# Patient Record
Sex: Male | Born: 1975 | Race: White | Hispanic: No | Marital: Married | Smoking: Never smoker
Health system: Southern US, Community
[De-identification: ages and names within clinical notes are randomized; demographics above are authoritative.]

## PROBLEM LIST (undated history)

## (undated) DIAGNOSIS — E0590 Thyrotoxicosis, unspecified without thyrotoxic crisis or storm: Secondary | ICD-10-CM

## (undated) DIAGNOSIS — G5600 Carpal tunnel syndrome, unspecified upper limb: Secondary | ICD-10-CM

## (undated) DIAGNOSIS — K602 Anal fissure, unspecified: Secondary | ICD-10-CM

## (undated) DIAGNOSIS — R519 Headache, unspecified: Secondary | ICD-10-CM

## (undated) DIAGNOSIS — T7840XA Allergy, unspecified, initial encounter: Secondary | ICD-10-CM

## (undated) DIAGNOSIS — R51 Headache: Secondary | ICD-10-CM

## (undated) DIAGNOSIS — R5383 Other fatigue: Secondary | ICD-10-CM

## (undated) DIAGNOSIS — K219 Gastro-esophageal reflux disease without esophagitis: Secondary | ICD-10-CM

## (undated) DIAGNOSIS — F419 Anxiety disorder, unspecified: Secondary | ICD-10-CM

## (undated) DIAGNOSIS — G8929 Other chronic pain: Secondary | ICD-10-CM

## (undated) HISTORY — DX: Allergy, unspecified, initial encounter: T78.40XA

## (undated) HISTORY — PX: TONSILLECTOMY: SUR1361

## (undated) HISTORY — DX: Headache: R51

## (undated) HISTORY — DX: Other fatigue: R53.83

## (undated) HISTORY — DX: Gastro-esophageal reflux disease without esophagitis: K21.9

## (undated) HISTORY — DX: Anal fissure, unspecified: K60.2

## (undated) HISTORY — PX: LASIK: SHX215

## (undated) HISTORY — DX: Thyrotoxicosis, unspecified without thyrotoxic crisis or storm: E05.90

## (undated) HISTORY — DX: Carpal tunnel syndrome, unspecified upper limb: G56.00

## (undated) HISTORY — DX: Headache, unspecified: R51.9

## (undated) HISTORY — DX: Anxiety disorder, unspecified: F41.9

## (undated) HISTORY — DX: Headache, unspecified: G89.29

---

## 2004-07-13 ENCOUNTER — Ambulatory Visit: Payer: Self-pay | Admitting: Family Medicine

## 2004-10-27 ENCOUNTER — Ambulatory Visit: Payer: Self-pay | Admitting: Family Medicine

## 2004-10-31 ENCOUNTER — Ambulatory Visit: Payer: Self-pay | Admitting: Family Medicine

## 2004-11-08 ENCOUNTER — Ambulatory Visit: Payer: Self-pay | Admitting: Family Medicine

## 2004-11-08 ENCOUNTER — Encounter: Admission: RE | Admit: 2004-11-08 | Discharge: 2004-11-08 | Payer: Self-pay | Admitting: Family Medicine

## 2005-07-10 HISTORY — PX: APPENDECTOMY: SHX54

## 2005-09-15 ENCOUNTER — Ambulatory Visit: Payer: Self-pay | Admitting: Family Medicine

## 2005-09-18 ENCOUNTER — Ambulatory Visit: Payer: Self-pay | Admitting: Family Medicine

## 2005-12-25 ENCOUNTER — Ambulatory Visit: Payer: Self-pay | Admitting: Cardiology

## 2005-12-25 ENCOUNTER — Inpatient Hospital Stay (HOSPITAL_COMMUNITY): Admission: EM | Admit: 2005-12-25 | Discharge: 2005-12-26 | Payer: Self-pay | Admitting: Emergency Medicine

## 2005-12-25 ENCOUNTER — Encounter (INDEPENDENT_AMBULATORY_CARE_PROVIDER_SITE_OTHER): Payer: Self-pay | Admitting: Specialist

## 2005-12-25 ENCOUNTER — Ambulatory Visit: Payer: Self-pay | Admitting: Family Medicine

## 2006-01-07 ENCOUNTER — Emergency Department (HOSPITAL_COMMUNITY): Admission: EM | Admit: 2006-01-07 | Discharge: 2006-01-08 | Payer: Self-pay | Admitting: Emergency Medicine

## 2006-03-19 ENCOUNTER — Ambulatory Visit: Payer: Self-pay | Admitting: Family Medicine

## 2006-08-30 ENCOUNTER — Ambulatory Visit: Payer: Self-pay | Admitting: Family Medicine

## 2006-10-10 ENCOUNTER — Ambulatory Visit: Payer: Self-pay | Admitting: Family Medicine

## 2006-10-26 ENCOUNTER — Ambulatory Visit: Payer: Self-pay | Admitting: Family Medicine

## 2006-10-29 ENCOUNTER — Ambulatory Visit: Payer: Self-pay | Admitting: Family Medicine

## 2007-02-28 ENCOUNTER — Telehealth (INDEPENDENT_AMBULATORY_CARE_PROVIDER_SITE_OTHER): Payer: Self-pay | Admitting: *Deleted

## 2007-07-13 IMAGING — CT CT ABDOMEN W/ CM
2 of 5 series · 17 of 46 positions shown, 19 images · IV contrast (APPLIED)
Comparison: none

CLINICAL DATA: Abdominal pain.  Diarrhea, nausea, question appendicitis. 
 ABDOMEN CT WITH CONTRAST:
TECHNIQUE: Multidetector CT imaging of the abdomen was performed following the standard protocol during bolus administration of intravenous contrast.
 Contrast:  125 cc Omnipaque 300
TECHNIQUE: Multidetector CT imaging of the pelvis was performed following the standard protocol during bolus administration of intravenous contrast.

[Series 2: abd_pel 5.0 b30f st · axial · 0.68mm/px · z∈[-432,-72]mm · 14 of 82 slices shown, 16 images]
[im 5/82  soft-tissue]
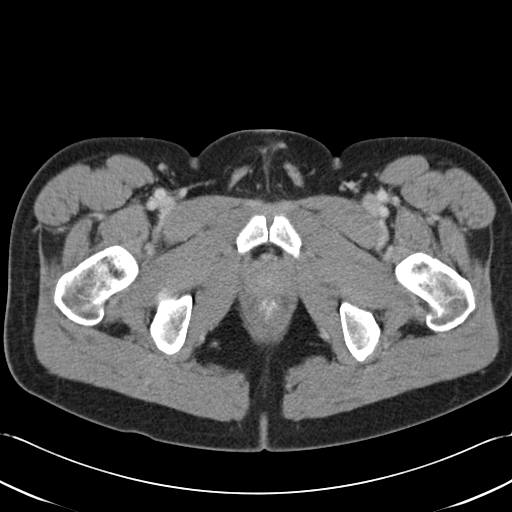
[im 5/82  bone]
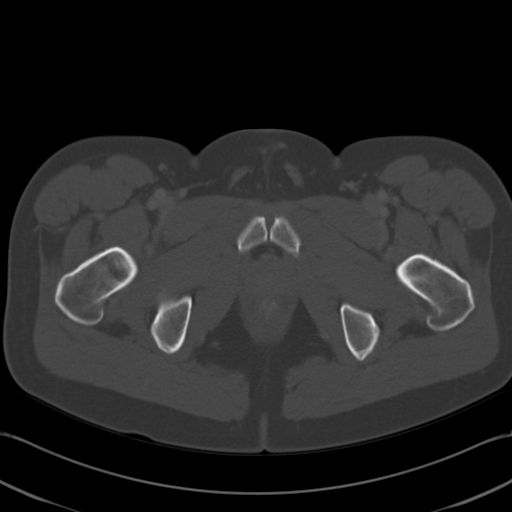
[im 10/82  soft-tissue]
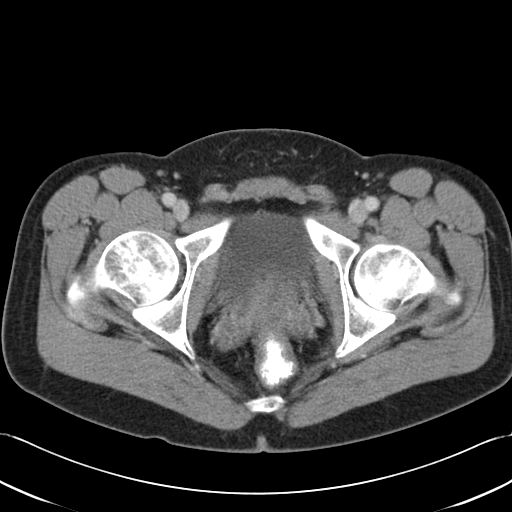
[im 15/82  soft-tissue]
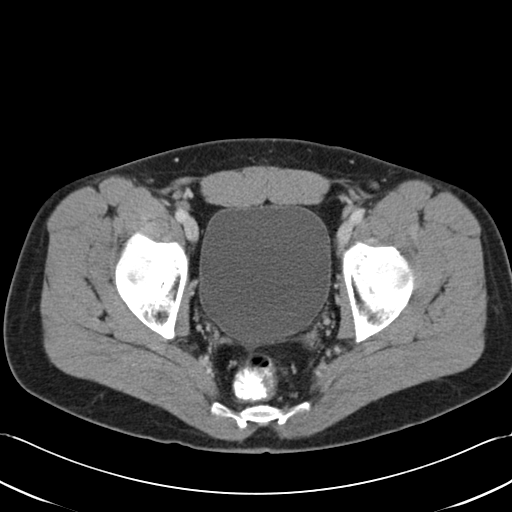
[im 24/82  soft-tissue]
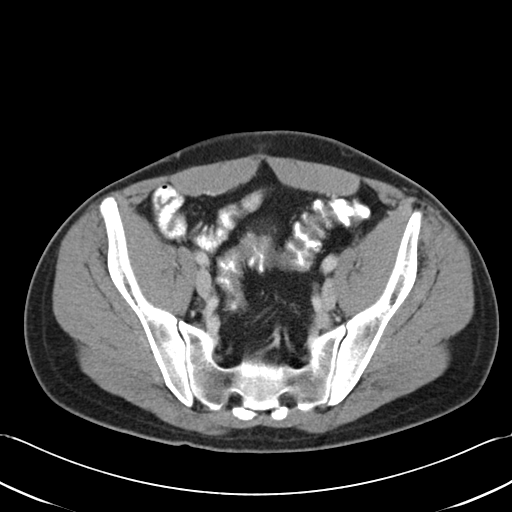
[im 29/82  soft-tissue]
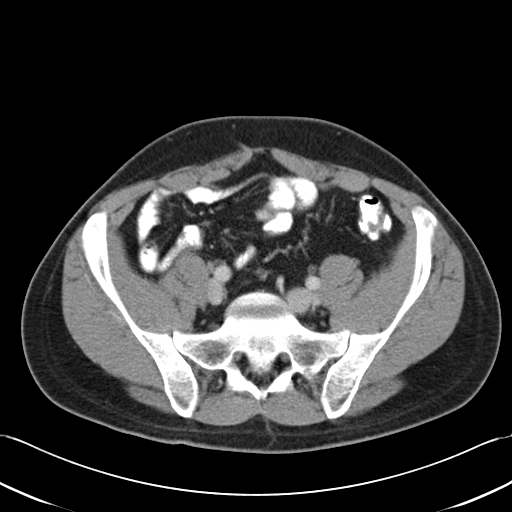
[im 34/82  soft-tissue]
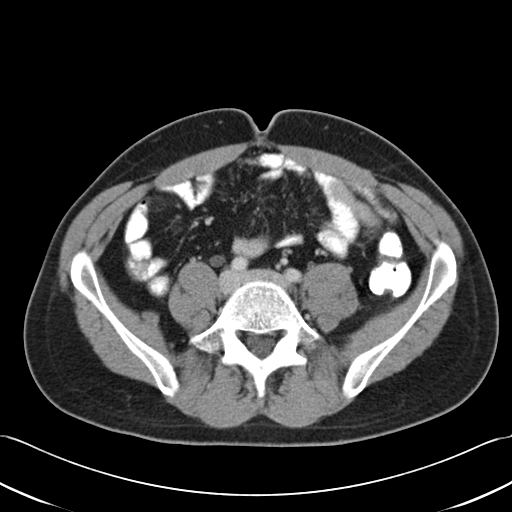
[im 39/82  soft-tissue]
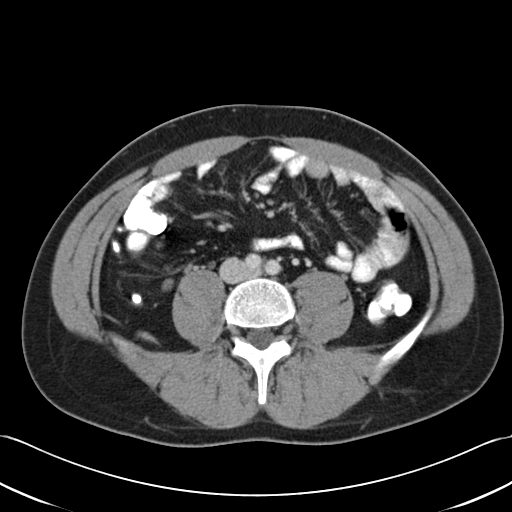
[im 43/82  soft-tissue]
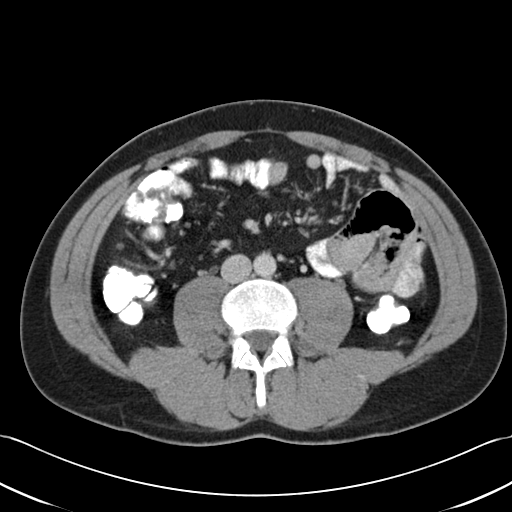
[im 48/82  soft-tissue]
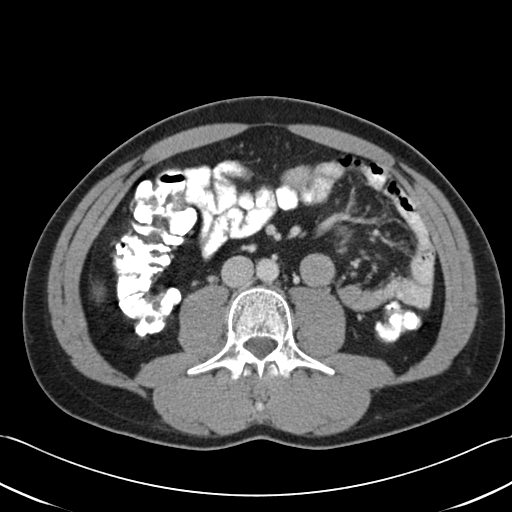
[im 48/82  bone]
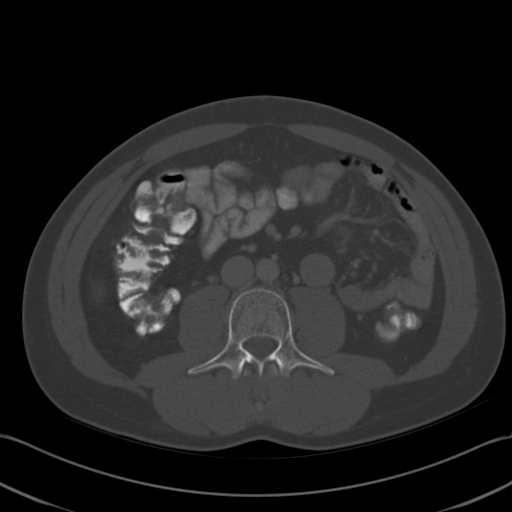
[im 53/82  soft-tissue]
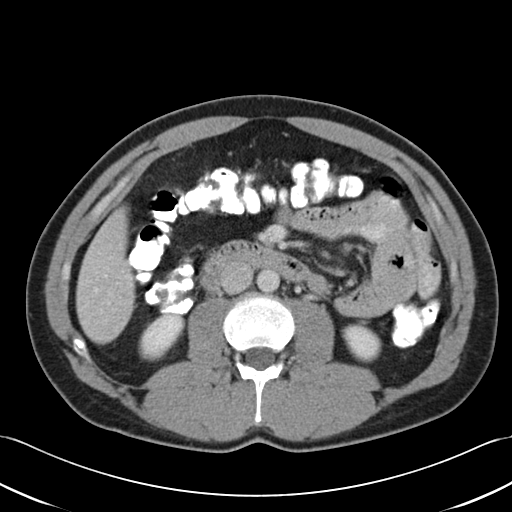
[im 62/82  soft-tissue]
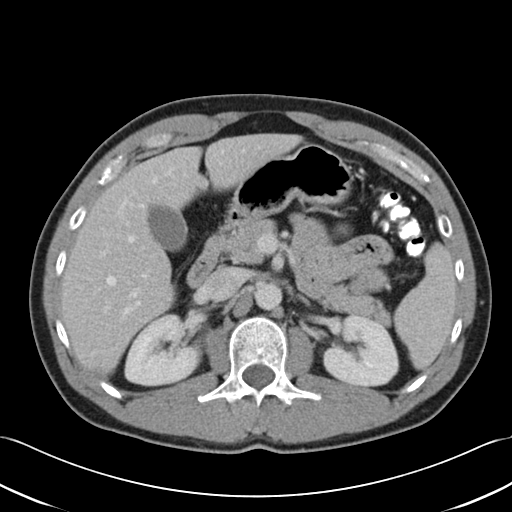
[im 67/82  soft-tissue]
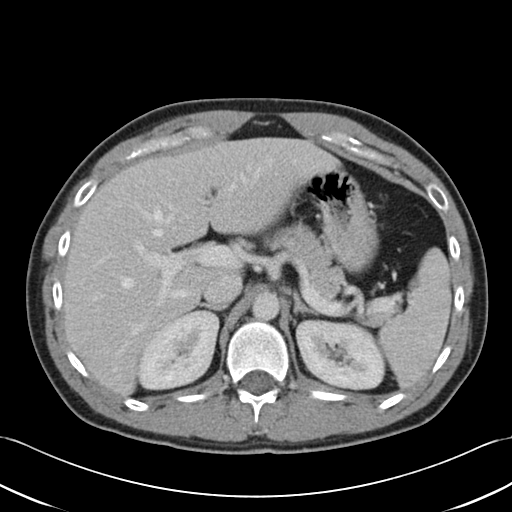
[im 72/82  soft-tissue]
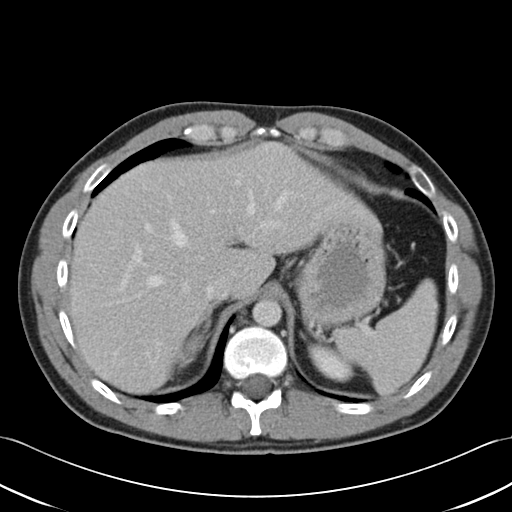
[im 77/82  soft-tissue]
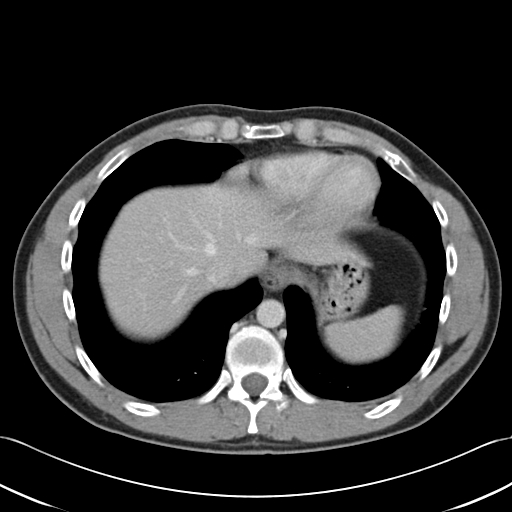

[Series 602: <mpr range> · coronal · 0.83mm/px · 3 of 39 slices shown]
[im 13/39  soft-tissue]
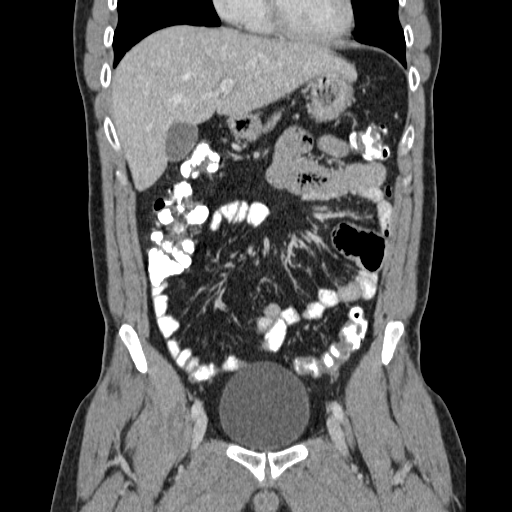
[im 17/39  soft-tissue]
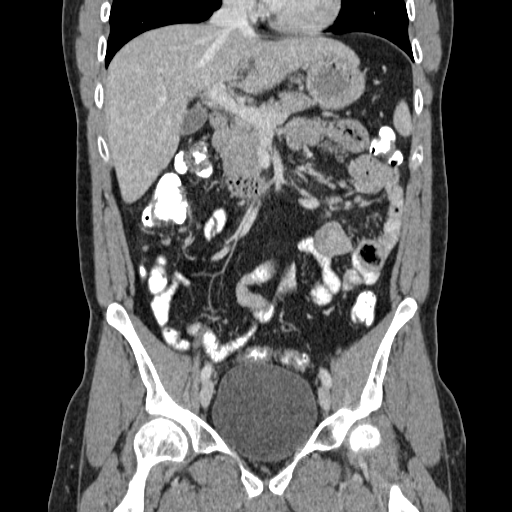
[im 22/39  soft-tissue]
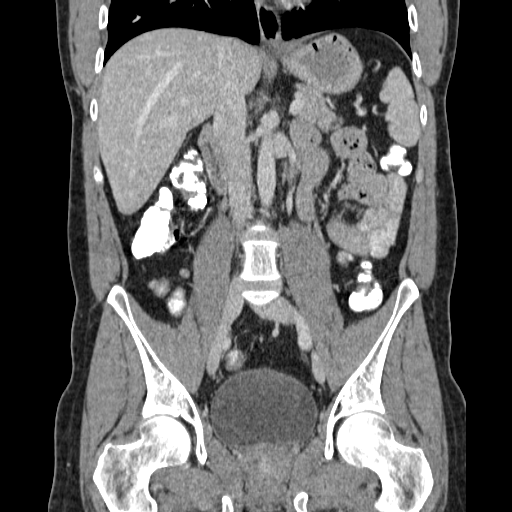

[17 of 46 positions shown; findings below may reference images not displayed]

FINDINGS: The appendix is distended measuring up to 10 mm in diameter with a thickened wall, particularly within its proximal portion.  There are several calcified appendicoliths present.  There is mild ill definition of the periappendiceal fat noted.  The liver, spleen, pancreas, kidneys, and adrenal glands have a normal appearance.  There is a small hiatal hernia.  There is no adenopathy and there is no free peritoneal fluid or air.  There are several mildly enlarged mesenteric lymph nodes seen within the right lower quadrant.
IMPRESSION: Findings are consistent with acute appendicitis with appendicolith as discussed above.  No evidence for an abscess.  Mildly enlarged, likely reactive lymph nodes within the mesentery right lower quadrant.  
 PELVIS CT WITH CONTRAST:
FINDINGS: There is no pelvic mass or adenopathy and there is no free pelvic fluid.  Mild sigmoid colonic diverticulosis is noted.  The urinary bladder is distended but has a normal appearance.
IMPRESSION: Negative CT of the pelvis.  Please see CT abdomen report for findings with regards to the appendix.

## 2007-10-09 ENCOUNTER — Ambulatory Visit: Payer: Self-pay | Admitting: Family Medicine

## 2007-10-09 DIAGNOSIS — R519 Headache, unspecified: Secondary | ICD-10-CM | POA: Insufficient documentation

## 2007-10-09 DIAGNOSIS — J45909 Unspecified asthma, uncomplicated: Secondary | ICD-10-CM | POA: Insufficient documentation

## 2007-10-09 DIAGNOSIS — J309 Allergic rhinitis, unspecified: Secondary | ICD-10-CM | POA: Insufficient documentation

## 2007-10-09 DIAGNOSIS — R51 Headache: Secondary | ICD-10-CM | POA: Insufficient documentation

## 2007-12-09 ENCOUNTER — Telehealth: Payer: Self-pay | Admitting: Family Medicine

## 2007-12-13 ENCOUNTER — Ambulatory Visit: Payer: Self-pay | Admitting: Family Medicine

## 2007-12-31 ENCOUNTER — Ambulatory Visit: Payer: Self-pay | Admitting: Family Medicine

## 2008-01-02 ENCOUNTER — Telehealth: Payer: Self-pay | Admitting: Gastroenterology

## 2008-01-07 ENCOUNTER — Ambulatory Visit: Payer: Self-pay | Admitting: Gastroenterology

## 2008-01-07 LAB — CONVERTED CEMR LAB
ALT: 35 units/L (ref 0–53)
AST: 26 units/L (ref 0–37)
Albumin: 4.4 g/dL (ref 3.5–5.2)
Alkaline Phosphatase: 56 units/L (ref 39–117)
Basophils Absolute: 0 10*3/uL (ref 0.0–0.1)
Basophils Relative: 0.3 % (ref 0.0–1.0)
Bilirubin, Direct: 0.1 mg/dL (ref 0.0–0.3)
Creatinine, Ser: 0.9 mg/dL (ref 0.4–1.5)
Folate: 12.9 ng/mL
Iron: 61 ug/dL (ref 42–165)
Lymphocytes Relative: 37 % (ref 12.0–46.0)
MCV: 88.8 fL (ref 78.0–100.0)
Monocytes Absolute: 0.4 10*3/uL (ref 0.1–1.0)
Neutrophils Relative %: 49.4 % (ref 43.0–77.0)
Potassium: 4.2 meq/L (ref 3.5–5.1)
RBC: 5.03 M/uL (ref 4.22–5.81)
Sed Rate: 4 mm/hr (ref 0–16)
TSH: 1.05 microintl units/mL (ref 0.35–5.50)
Total Protein: 7 g/dL (ref 6.0–8.3)
Transferrin: 229.3 mg/dL (ref >=212.0)
Vitamin B-12: 797 pg/mL (ref 211–911)

## 2008-01-08 ENCOUNTER — Ambulatory Visit (HOSPITAL_COMMUNITY): Admission: RE | Admit: 2008-01-08 | Discharge: 2008-01-08 | Payer: Self-pay | Admitting: Gastroenterology

## 2008-01-15 ENCOUNTER — Ambulatory Visit: Payer: Self-pay | Admitting: Gastroenterology

## 2008-01-15 ENCOUNTER — Encounter: Payer: Self-pay | Admitting: Gastroenterology

## 2008-01-15 HISTORY — PX: ESOPHAGOGASTRODUODENOSCOPY: SHX1529

## 2008-01-17 ENCOUNTER — Encounter: Payer: Self-pay | Admitting: Gastroenterology

## 2008-02-20 ENCOUNTER — Ambulatory Visit: Payer: Self-pay | Admitting: Gastroenterology

## 2008-04-14 ENCOUNTER — Ambulatory Visit: Payer: Self-pay | Admitting: Family Medicine

## 2008-07-08 ENCOUNTER — Ambulatory Visit: Payer: Self-pay | Admitting: Family Medicine

## 2008-08-28 ENCOUNTER — Ambulatory Visit: Payer: Self-pay | Admitting: Family Medicine

## 2008-11-11 ENCOUNTER — Ambulatory Visit: Payer: Self-pay | Admitting: Family Medicine

## 2009-05-17 ENCOUNTER — Ambulatory Visit: Payer: Self-pay | Admitting: Family Medicine

## 2009-07-26 IMAGING — US US ABDOMEN COMPLETE
1 series · 14 of 25 positions shown · non-contrast
Comparison: CT 01/07/2006

CLINICAL DATA: Abdominal pain

ABDOMEN ULTRASOUND
TECHNIQUE: Complete abdominal ultrasound examination was performed
including evaluation of the liver, gallbladder, bile ducts,
pancreas, kidneys, spleen, IVC, and abdominal aorta.

[Series 1: unknown · 0.33mm/px · 14 of 87 slices shown]
[im 1/87]
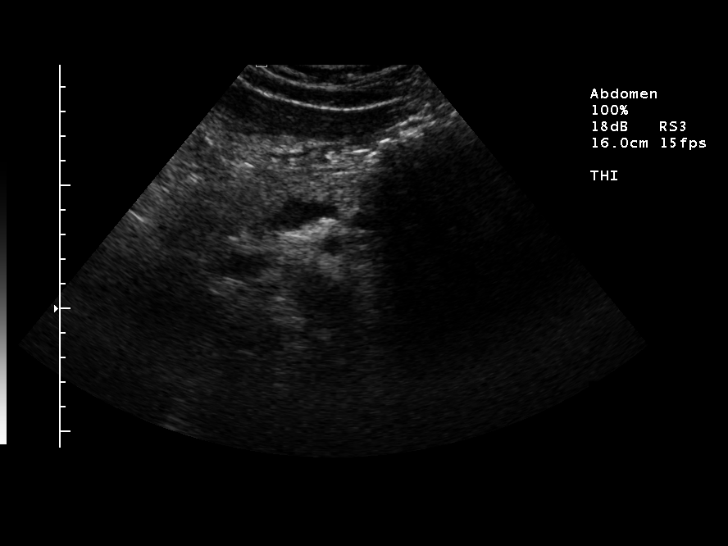
[im 8/87]
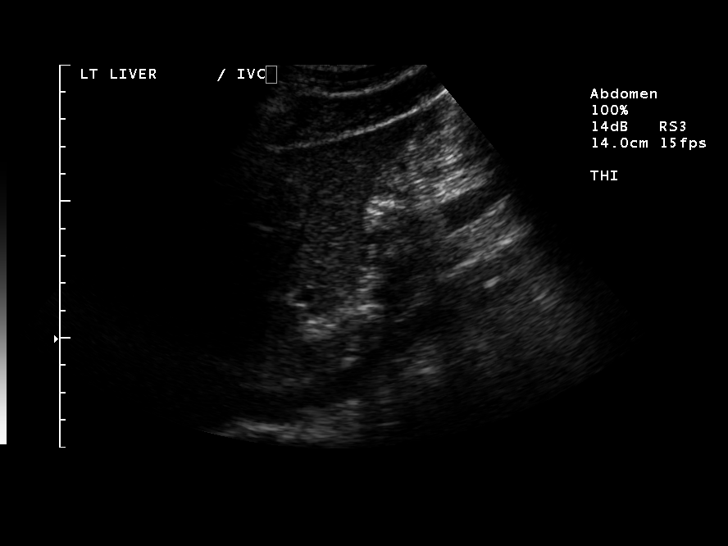
[im 15/87]
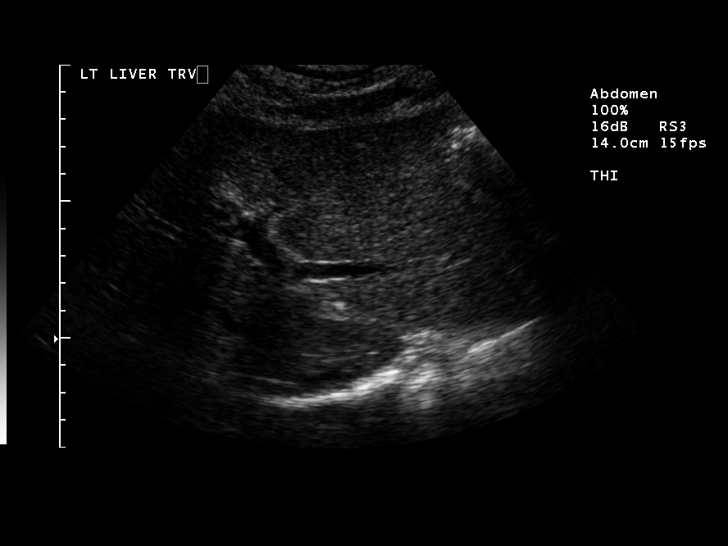
[im 22/87]
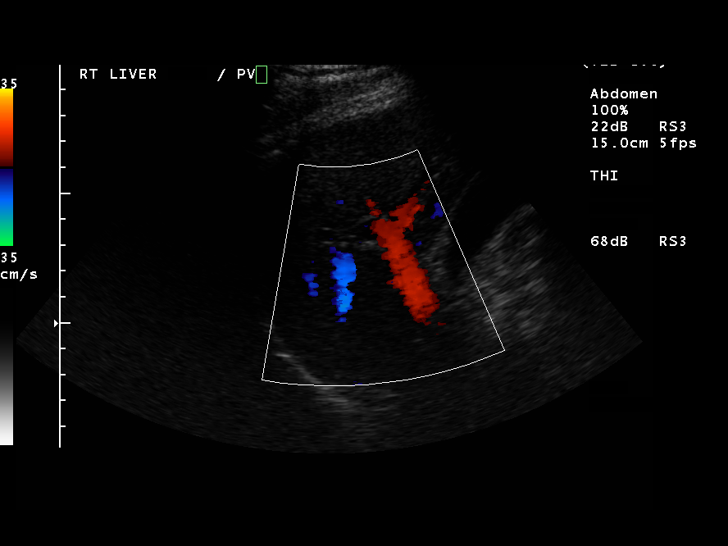
[im 29/87]
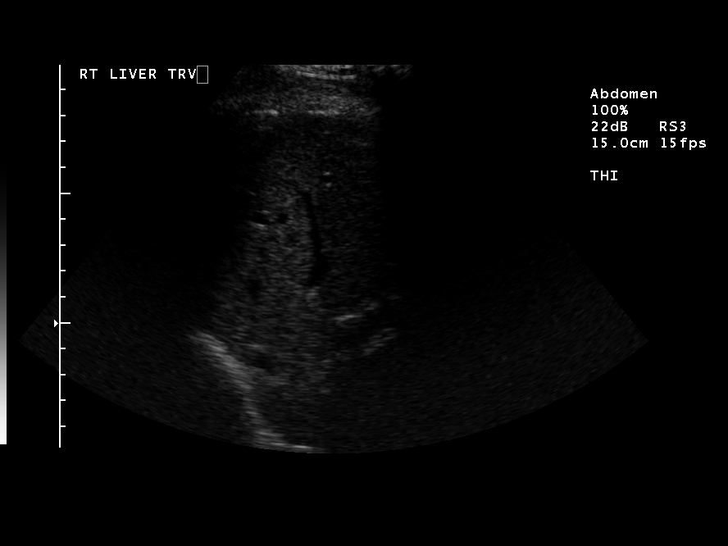
[im 33/87]
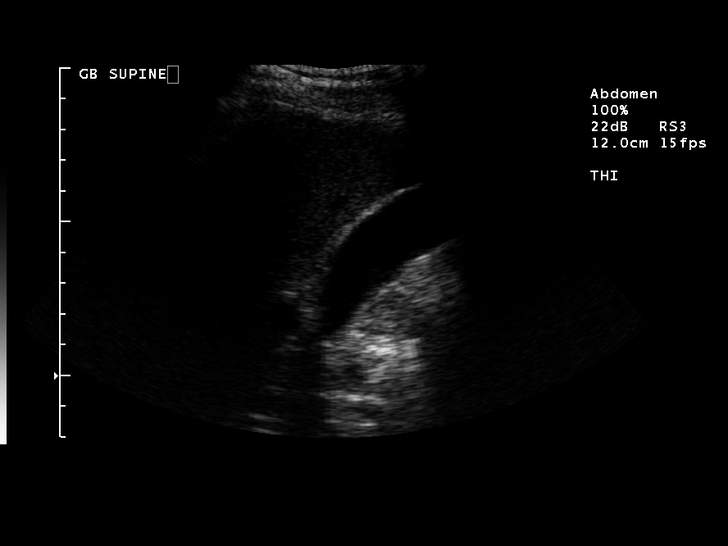
[im 40/87]
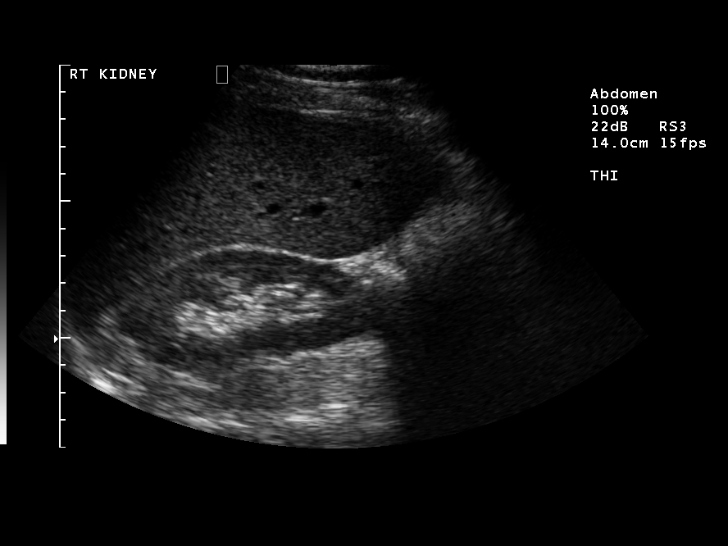
[im 47/87]
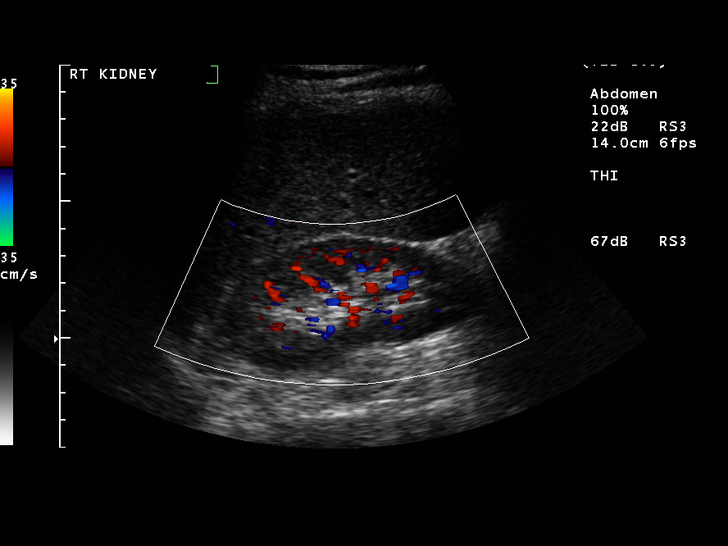
[im 54/87]
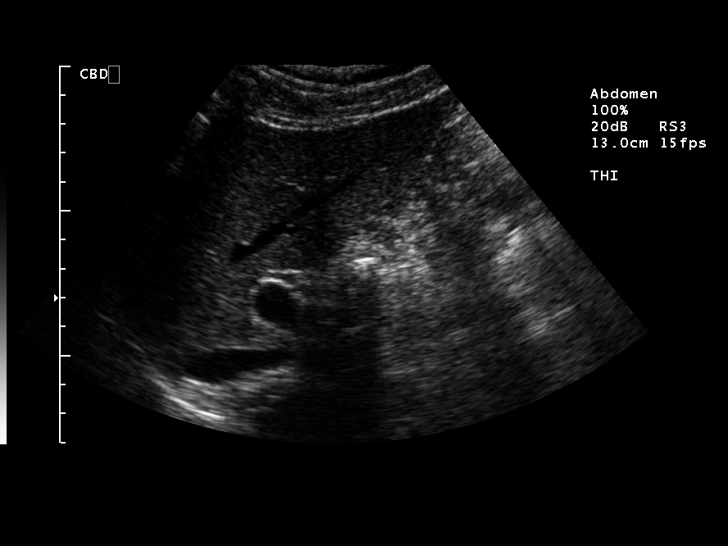
[im 58/87]
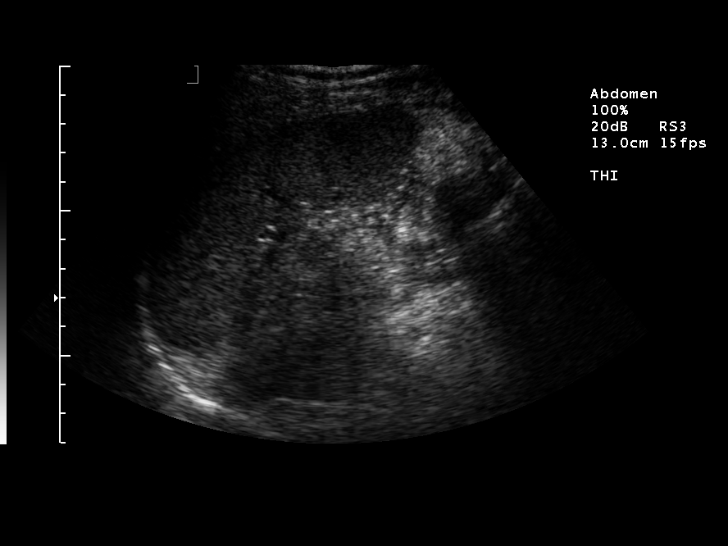
[im 65/87]
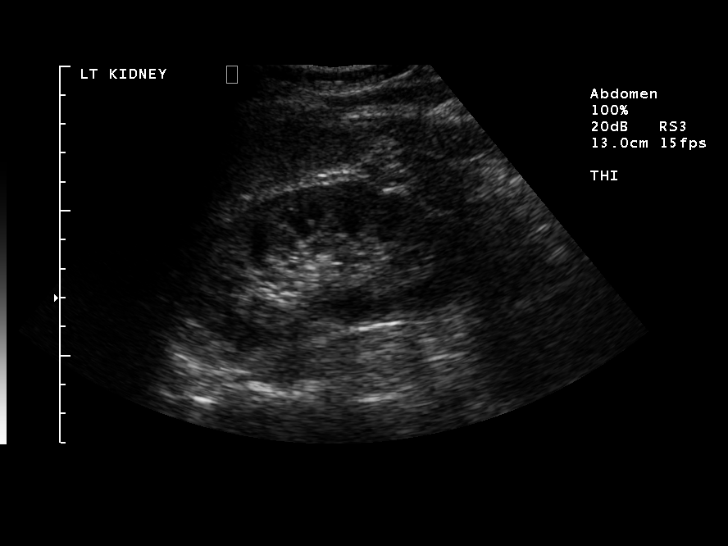
[im 72/87]
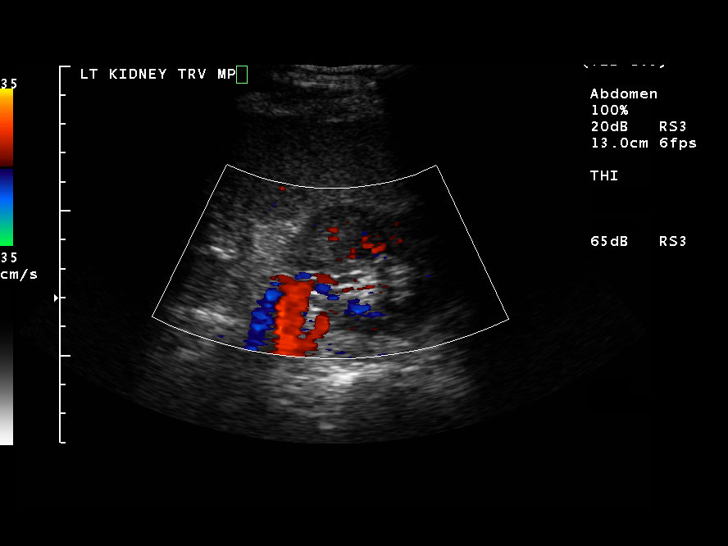
[im 79/87]
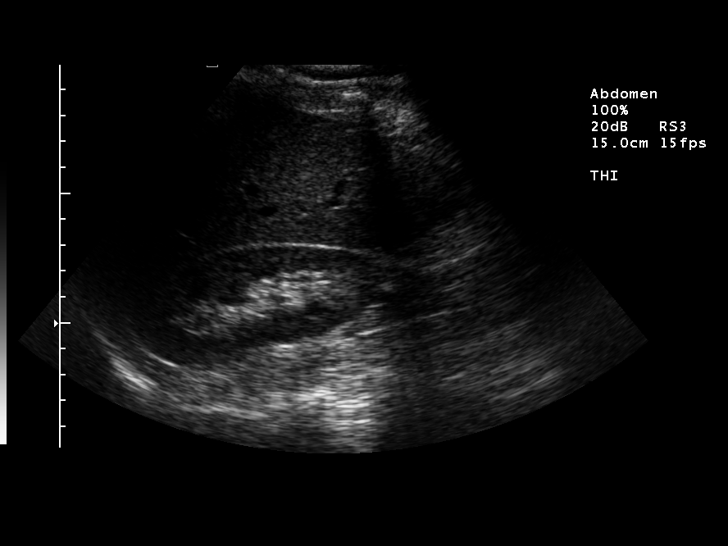
[im 87/87]
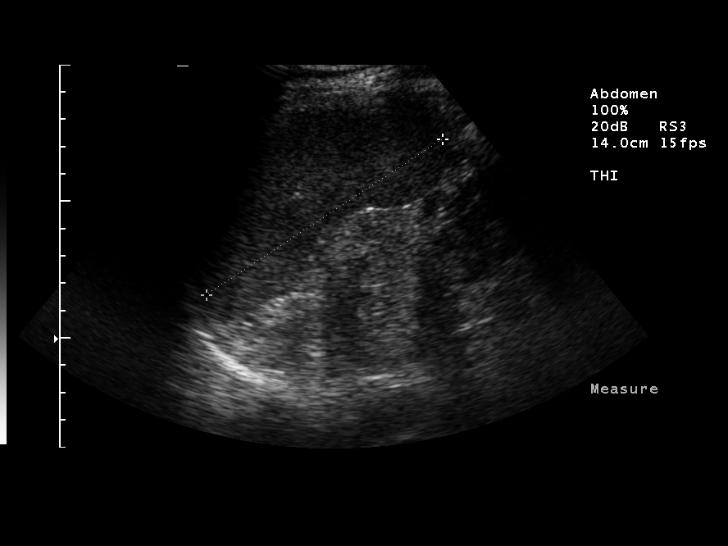

[14 of 25 positions shown; findings below may reference images not displayed]

FINDINGS: The gallbladder is well distended and there are no
gallstones.  The common bile duct is 2.9 mm.  The liver and IVC are
normal.  The pancreatic head and body are normal and the tail was
obscured by bowel gas.  The spleen, kidneys, and aorta are normal.
IMPRESSION: Negative

## 2009-08-16 ENCOUNTER — Encounter: Payer: Self-pay | Admitting: Family Medicine

## 2009-09-15 ENCOUNTER — Ambulatory Visit: Payer: Self-pay | Admitting: Family Medicine

## 2009-09-15 LAB — CONVERTED CEMR LAB
Nitrite: NEGATIVE
Protein, U semiquant: NEGATIVE

## 2009-09-16 LAB — CONVERTED CEMR LAB
ALT: 48 units/L (ref 0–53)
Albumin: 4.2 g/dL (ref 3.5–5.2)
Alkaline Phosphatase: 56 units/L (ref 39–117)
Basophils Relative: 0.3 % (ref 0.0–3.0)
Chloride: 107 meq/L (ref 96–112)
Cholesterol: 183 mg/dL (ref 0–200)
Eosinophils Relative: 2.4 % (ref 0.0–5.0)
HCT: 44.1 % (ref 39.0–52.0)
HDL: 43.4 mg/dL (ref >39.00)
Lymphocytes Relative: 34.1 % (ref 12.0–46.0)
Lymphs Abs: 1.5 10*3/uL (ref 0.7–4.0)
MCHC: 32.8 g/dL (ref 30.0–36.0)
Monocytes Relative: 10.7 % (ref 3.0–12.0)
Platelets: 195 10*3/uL (ref 150.0–400.0)
Potassium: 4 meq/L (ref 3.5–5.1)
RDW: 12.5 % (ref 11.5–14.6)
Sodium: 143 meq/L (ref 135–145)
TSH: 0.16 microintl units/mL — ABNORMAL LOW (ref 0.35–5.50)
Total CHOL/HDL Ratio: 4
WBC: 4.5 10*3/uL (ref 4.5–10.5)

## 2009-09-21 ENCOUNTER — Ambulatory Visit: Payer: Self-pay | Admitting: Family Medicine

## 2009-09-22 LAB — CONVERTED CEMR LAB
Free T4: 1.6 ng/dL (ref 0.6–1.6)
TSH: 0.06 microintl units/mL — ABNORMAL LOW (ref 0.35–5.50)

## 2009-10-01 ENCOUNTER — Ambulatory Visit: Payer: Self-pay | Admitting: Endocrinology

## 2009-10-08 ENCOUNTER — Encounter: Admission: RE | Admit: 2009-10-08 | Discharge: 2009-10-08 | Payer: Self-pay | Admitting: Endocrinology

## 2009-10-19 ENCOUNTER — Encounter (HOSPITAL_COMMUNITY): Admission: RE | Admit: 2009-10-19 | Discharge: 2010-01-17 | Payer: Self-pay | Admitting: Endocrinology

## 2010-07-06 ENCOUNTER — Ambulatory Visit
Admission: RE | Admit: 2010-07-06 | Discharge: 2010-07-06 | Payer: Self-pay | Source: Home / Self Care | Attending: Family Medicine | Admitting: Family Medicine

## 2010-07-06 DIAGNOSIS — G5600 Carpal tunnel syndrome, unspecified upper limb: Secondary | ICD-10-CM | POA: Insufficient documentation

## 2010-08-03 ENCOUNTER — Ambulatory Visit
Admission: RE | Admit: 2010-08-03 | Discharge: 2010-08-03 | Payer: Self-pay | Source: Home / Self Care | Attending: Family Medicine | Admitting: Family Medicine

## 2010-08-09 NOTE — Assessment & Plan Note (Signed)
Summary: NEW / HYPERTHYROIDISM / # / [employer] / FRY / CD   Vital Signs:  Patient profile:   35 year old male Height:      69 inches (175.26 cm) Weight:      164.75 pounds (74.89 kg) O2 Sat:      97 % on Room air Temp:     98.2 degrees F (36.78 degrees C) oral Pulse rate:   78 / minute BP sitting:   114 / 80  (left arm) Cuff size:   regular  Vitals Entered By: Josph Macho RMA (October 01, 2009 2:27 PM) Taken by Sydell Axon  O2 Flow:  Room air CC: New endo pt: hyperthyroidism //[place]   Primary Provider:  Gershon Crane MD  CC:  New endo pt: hyperthyroidism //[place].  History of Present Illness: pt was noted on recent routine labs to have abnormally suppressed tsh (was normal in 2009).  symptomatically, pt states few weeks of slight "fogginess" in the head, but no associated loc.  Current Medications (verified): 1)  Singulair 10 Mg Tabs (Montelukast Sodium) .Marland Kitchen.. 1 By Mouth Daily 2)  Allegra 180 Mg Tabs (Fexofenadine Hcl) .Marland Kitchen.. 1 By Mouth Once Daily 3)  Nexium 40 Mg  Cpdr (Esomeprazole Magnesium) .... Two Times A Day 4)  Epipen 2-Pak 0.3 Mg/0.20ml (1:1000)  Devi (Epinephrine Hcl (Anaphylaxis)) .... As Needed  Allergies (verified): No Known Drug Allergies  Past History:  Past Medical History: Last updated: 09/21/2009 Allergic rhinitis Asthma Headache GERD  Family History: Reviewed history from 02/20/2008 and no changes required. Family History High cholesterol Family History Hypertension Family History of Heart Disease: father, grandfather ? Family History of Liver Disease/Cirrhosis:father No FH of Colon Cancer no goiter or other thyroid dz.  Social History: Reviewed history from 01/07/2008 and no changes required. 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  Review of Systems       denies headache, hoarseness, double vision, palpitations, sob, diarrhea, polyuria, myalgias, excessive  diaphoresis, numbness, tremor, anxiety, hypoglycemia, bruising, rhinorrhea.  he has few lbs weight gain, and chronic headache.   Physical Exam  General:  normal appearance.   Head:  head: no deformity eyes: no periorbital swelling, no proptosis external nose and ears are normal mouth: no lesion seen Neck:  ? of 2 cm right thyroid nodule. Lungs:  Clear to auscultation bilaterally. Normal respiratory effort.  Heart:  Regular rate and rhythm without murmurs or gallops noted. Normal S1,S2.   Msk:  muscle bulk and strength are grossly normal.  no obvious joint swelling.  gait is normal and steady  Extremities:  no deformity no edema Neurologic:  cn 2-12 grossly intact.   readily moves all 4's.   sensation is intact to touch on all 4's no tremor Skin:  normal texture and temp.  no rash.  not diaphoretic  Cervical Nodes:  No significant adenopathy.  Psych:  Alert and cooperative; normal mood and affect; normal attention span and concentration.   Additional Exam:  FastTSH              [L]  0.06 uIU/mL                 0.35-5.50 Free T4                   1.6 ng/dL                   1.6-1.0 Free T3              [  H]  5.3 pg/mL     Impression & Recommendations:  Problem # 1:  HYPERTHYROIDISM (ICD-242.90) uncertain etiology  Problem # 2:  GERD (ICD-530.81) this could be exac by #1  Problem # 3:  weight gain sometimes seen with #1  Other Orders: Radiology Referral (Radiology) Radiology Referral (Radiology) Consultation Level IV (57846)  Patient Instructions: 1)  we discussed the causes, risks, and treatment options of hyperthyroidism (overactive thyroid) 2)  check "scan" (a special but easy type of thyroid x ray), and ultrasound. 3)  tests are being ordered for you today.  a few days after the test(s), please call [phone] to hear your test results.

## 2010-08-09 NOTE — Assessment & Plan Note (Signed)
Summary: CPX // [facility]   Vital Signs:  Patient profile:   35 year old male Weight:      164 pounds BMI:     25.03 BP sitting:   120 / 90  (left arm) Cuff size:   regular  Vitals Entered By: Raechel Ache, RN (September 21, 2009 2:58 PM) CC: CPX, labs done. C/o abd discomfort and GERD. Is Patient Diabetic? No   History of Present Illness: 35 yr old male for a cpx. He feels fine except for some frequent epigastric pains and occasional lower abdomenal cramps. he had been taking Nexium two times a day and felt good until a year ago, when he decreased the Nexium to once a day. His BMs are regular. Weight is stable. recent labs showed a slightly low TSH.   Preventive Screening-Counseling & Management  Alcohol-Tobacco     Smoking Status: never  Allergies (verified): No Known Drug Allergies  Past History:  Past Medical History: Allergic rhinitis Asthma Headache GERD  Past Surgical History: Tonsillectomy Appendectomy lasik eye surgery EGD 01-15-08 per Dr. Sheryn Bison, showed GERD only  Family History: Reviewed history from 02/20/2008 and no changes required. Family History High cholesterol Family History Hypertension Family History of Heart Disease: father, grandfather ? Family History of Liver Disease/Cirrhosis:father No FH of Colon Cancer  Social History: Reviewed history from 01/07/2008 and no changes required. 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  Review of Systems  The patient denies anorexia, fever, weight loss, weight gain, vision loss, decreased hearing, hoarseness, chest pain, syncope, dyspnea on exertion, peripheral edema, prolonged cough, headaches, hemoptysis, melena, hematochezia, severe indigestion/heartburn, hematuria, incontinence, genital sores, muscle weakness, suspicious skin lesions, transient blindness, difficulty walking, depression, unusual weight change, abnormal  bleeding, enlarged lymph nodes, angioedema, breast masses, and testicular masses.    Physical Exam  General:  Well-developed,well-nourished,in no acute distress; alert,appropriate and cooperative throughout examination Head:  Normocephalic and atraumatic without obvious abnormalities. No apparent alopecia or balding. Eyes:  No corneal or conjunctival inflammation noted. EOMI. Perrla. Funduscopic exam benign, without hemorrhages, exudates or papilledema. Vision grossly normal. Ears:  External ear exam shows no significant lesions or deformities.  Otoscopic examination reveals clear canals, tympanic membranes are intact bilaterally without bulging, retraction, inflammation or discharge. Hearing is grossly normal bilaterally. Nose:  External nasal examination shows no deformity or inflammation. Nasal mucosa are pink and moist without lesions or exudates. Mouth:  Oral mucosa and oropharynx without lesions or exudates.  Teeth in good repair. Neck:  No deformities, masses, or tenderness noted. Chest Wall:  No deformities, masses, tenderness or gynecomastia noted. Lungs:  Normal respiratory effort, chest expands symmetrically. Lungs are clear to auscultation, no crackles or wheezes. Heart:  Normal rate and regular rhythm. S1 and S2 normal without gallop, murmur, click, rub or other extra sounds.  Abdomen:  Bowel sounds positive,abdomen soft and non-tender without masses, organomegaly or hernias noted. Genitalia:  Testes bilaterally descended without nodularity, tenderness or masses. No scrotal masses or lesions. No penis lesions or urethral discharge. Msk:  No deformity or scoliosis noted of thoracic or lumbar spine.   Pulses:  R and L carotid,radial,femoral,dorsalis pedis and posterior tibial pulses are full and equal bilaterally Extremities:  No clubbing, cyanosis, edema, or deformity noted with normal full range of motion of all joints.   Neurologic:  No cranial nerve deficits noted. Station and gait  are normal. Plantar reflexes are down-going bilaterally. DTRs are symmetrical throughout.  Sensory, motor and coordinative functions appear intact. Skin:  Intact without suspicious lesions or rashes Cervical Nodes:  No lymphadenopathy noted Axillary Nodes:  No palpable lymphadenopathy Inguinal Nodes:  No significant adenopathy Psych:  Cognition and judgment appear intact. Alert and cooperative with normal attention span and concentration. No apparent delusions, illusions, hallucinations   Impression & Recommendations:  Problem # 1:  HEALTH MAINTENANCE EXAM (ICD-V70.0)  Problem # 2:  THYROID DISORDER (ICD-246.9)  Orders: Venipuncture (91478) TLB-TSH (Thyroid Stimulating Hormone) (84443-TSH) TLB-T4 (Thyrox), Free [phone]) TLB-T3, Free (Triiodothyronine) (84481-T3FREE)  Complete Medication List: 1)  Singulair 10 Mg Tabs (Montelukast sodium) .Marland Kitchen.. 1 by mouth daily 2)  Allegra 180 Mg Tabs (Fexofenadine hcl) .Marland Kitchen.. 1 by mouth once daily 3)  Nexium 40 Mg Cpdr (Esomeprazole magnesium) .... Two times a day 4)  Epipen 2-pak 0.3 Mg/0.69ml (1:1000) Devi (Epinephrine hcl (anaphylaxis)) .... As needed  Other Orders: Tdap => 40yrs IM (65784) Admin 1st Vaccine (69629)  Patient Instructions: 1)  Increase Nexium back to two times a day . Check a full thyroid panel. Prescriptions: EPIPEN 2-PAK 0.3 MG/0.3ML (1:1000)  DEVI (EPINEPHRINE HCL (ANAPHYLAXIS)) as needed  #1 x 2   Entered and Authorized by:   Nelwyn Salisbury MD   Signed by:   Nelwyn Salisbury MD on 09/21/2009   Method used:   Electronically to        [facility]  [address] [phone]* (retail)       [address]       [place], [place]  [address]       Ph: [phone] or [phone]       Fax: [phone]   RxID:   [id number] NEXIUM 40 MG  CPDR (ESOMEPRAZOLE MAGNESIUM) two times a day  #60 x 11   Entered and Authorized by:   Nelwyn Salisbury MD   Signed by:   Nelwyn Salisbury MD on 09/21/2009   Method used:   Electronically to         [facility]  [address] [phone]* (retail)       [address]       [place], [place]  [address]       Ph: [phone] or [phone]       Fax: [phone]   RxID:   [id number]    Immunizations Administered:  Tetanus Vaccine:    Vaccine Type: Tdap    Site: right deltoid    Mfr: GlaxoSmithKline    Dose: 0.5 ml    Route: IM    Given by: Raechel Ache, RN    Exp. Date: 05/05/2010    Lot #: [id number]    VIS given: 05/28/07 version given September 21, 2009.

## 2010-08-09 NOTE — Consult Note (Signed)
Summary: [facility]  [facility]   Imported By: Maryln Gottron 08/23/2009 12:17:35  _____________________________________________________________________  External Attachment:    Type:   Image     Comment:   External Document

## 2010-08-11 NOTE — Assessment & Plan Note (Signed)
Summary: ?sinus inf/njr   Vital Signs:  Patient profile:   35 year old male O2 Sat:      98 % Temp:     9.4 degrees F Pulse rate:   94 / minute BP sitting:   110 / 76  (left arm)  Vitals Entered By: Pura Spice, RN (August 03, 2010 3:28 PM) CC: sinus infection    History of Present Illness: Here for 2 weeks of sinus pressure, PND, and HAs. No cough or fever. On Mucinex DM.   Allergies: No Known Drug Allergies  Past History:  Past Medical History: Reviewed history from 09/21/2009 and no changes required. Allergic rhinitis Asthma Headache GERD  Review of Systems  The patient denies anorexia, fever, weight loss, weight gain, vision loss, decreased hearing, hoarseness, chest pain, syncope, dyspnea on exertion, peripheral edema, prolonged cough, hemoptysis, abdominal pain, melena, hematochezia, severe indigestion/heartburn, hematuria, incontinence, genital sores, muscle weakness, suspicious skin lesions, transient blindness, difficulty walking, depression, unusual weight change, abnormal bleeding, enlarged lymph nodes, angioedema, breast masses, and testicular masses.    Physical Exam  General:  Well-developed,well-nourished,in no acute distress; alert,appropriate and cooperative throughout examination Head:  Normocephalic and atraumatic without obvious abnormalities. No apparent alopecia or balding. Eyes:  No corneal or conjunctival inflammation noted. EOMI. Perrla. Funduscopic exam benign, without hemorrhages, exudates or papilledema. Vision grossly normal. Ears:  External ear exam shows no significant lesions or deformities.  Otoscopic examination reveals clear canals, tympanic membranes are intact bilaterally without bulging, retraction, inflammation or discharge. Hearing is grossly normal bilaterally. Nose:  External nasal examination shows no deformity or inflammation. Nasal mucosa are pink and moist without lesions or exudates. Mouth:  Oral mucosa and oropharynx without  lesions or exudates.  Teeth in good repair. Neck:  No deformities, masses, or tenderness noted. Lungs:  Normal respiratory effort, chest expands symmetrically. Lungs are clear to auscultation, no crackles or wheezes.   Impression & Recommendations:  Problem # 1:  ACUTE SINUSITIS, UNSPECIFIED (ICD-461.9)  His updated medication list for this problem includes:    Zithromax Z-pak 250 Mg Tabs (Azithromycin) .Marland Kitchen... As directed  Complete Medication List: 1)  Singulair 10 Mg Tabs (Montelukast sodium) .Marland Kitchen.. 1 by mouth daily 2)  Allegra 180 Mg Tabs (Fexofenadine hcl) .Marland Kitchen.. 1 by mouth once daily 3)  Nexium 40 Mg Cpdr (Esomeprazole magnesium) .... Two times a day 4)  Epipen 2-pak 0.3 Mg/0.45ml (1:1000) Devi (Epinephrine hcl (anaphylaxis)) .... As needed 5)  Zithromax Z-pak 250 Mg Tabs (Azithromycin) .... As directed  Patient Instructions: 1)  Please schedule a follow-up appointment as needed .  Prescriptions: ZITHROMAX Z-PAK 250 MG TABS (AZITHROMYCIN) as directed  #1 x 0   Entered and Authorized by:   Nelwyn Salisbury MD   Signed by:   Nelwyn Salisbury MD on 08/03/2010   Method used:   Electronically to        [facility]  [address] [phone]* (retail)       [address]       [place], [place]  [address]       Ph: [phone] or [phone]       Fax: [phone]   RxID:   [id number]    Orders Added: 1)  Est. Patient Level IV [37628]

## 2010-08-11 NOTE — Assessment & Plan Note (Signed)
Summary: consult re: numbness in fingers/some pain/cjr   Vital Signs:  Patient profile:   35 year old male Weight:      162 pounds BMI:     24.01 Pulse rate:   70 / minute BP sitting:   108 / 72  Vitals Entered By: Kyung Rudd, CMA (July 06, 2010 2:25 PM) CC: tingling in rt hand x 1wk   History of Present Illness: Here for one month of intermittent pain in the right hand , wrist, and forearm. No trauma hx. Also gets frequent tingling or numbness in the hand and all 5 fingers. Often this is worst when he wakes up in the morning. 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  Advil helps .  Current Medications (verified): 1)  Singulair 10 Mg Tabs (Montelukast Sodium) .Marland Kitchen.. 1 By Mouth Daily 2)  Allegra 180 Mg Tabs (Fexofenadine Hcl) .Marland Kitchen.. 1 By Mouth Once Daily 3)  Nexium 40 Mg  Cpdr (Esomeprazole Magnesium) .... Two Times A Day 4)  Epipen 2-Pak 0.3 Mg/0.19ml (1:1000)  Devi (Epinephrine Hcl (Anaphylaxis)) .... As Needed  Allergies (verified): No Known Drug Allergies  Past History:  Past Medical History: Reviewed history from 09/21/2009 and no changes required. Allergic rhinitis Asthma Headache GERD  Past Surgical History: Reviewed history from 09/21/2009 and no changes required. Tonsillectomy Appendectomy lasik eye surgery EGD 01-15-08 per Dr. Sheryn Bison, showed GERD only  Social History: Reviewed history from 01/07/2008 and no changes required. 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  Review of Systems  The patient denies anorexia, fever, weight loss, weight gain, vision loss, decreased hearing, hoarseness, chest pain, syncope, dyspnea on exertion, peripheral edema, prolonged cough, headaches, hemoptysis, abdominal pain, melena, hematochezia, severe indigestion/heartburn, hematuria, incontinence, genital sores, muscle weakness, suspicious skin lesions, transient blindness,  difficulty walking, depression, unusual weight change, abnormal bleeding, enlarged lymph nodes, angioedema, breast masses, and testicular masses.    Physical Exam  General:  Well-developed,well-nourished,in no acute distress; alert,appropriate and cooperative throughout examination Extremities:  No clubbing, cyanosis, edema, or deformity noted with normal full range of motion of all joints.  The right hand is not tender or swollen. Tinel's is positive   Impression & Recommendations:  Problem # 1:  CARPAL TUNNEL SYNDROME (ICD-354.0)  Complete Medication List: 1)  Singulair 10 Mg Tabs (Montelukast sodium) .Marland Kitchen.. 1 by mouth daily 2)  Allegra 180 Mg Tabs (Fexofenadine hcl) .Marland Kitchen.. 1 by mouth once daily 3)  Nexium 40 Mg Cpdr (Esomeprazole magnesium) .... Two times a day 4)  Epipen 2-pak 0.3 Mg/0.64ml (1:1000) Devi (Epinephrine hcl (anaphylaxis)) .... As needed  Patient Instructions: 1)  wear a wrist splint in bed every night. If this does not help, wear the splint 24 hours a day. Follow up if no better in 4 weeks   Orders Added: 1)  Est. Patient Level III [84696]

## 2010-09-06 ENCOUNTER — Ambulatory Visit (INDEPENDENT_AMBULATORY_CARE_PROVIDER_SITE_OTHER): Payer: BC Managed Care – HMO | Admitting: Family Medicine

## 2010-09-06 ENCOUNTER — Encounter: Payer: Self-pay | Admitting: Family Medicine

## 2010-09-06 VITALS — BP 122/90 | HR 95 | Temp 98.8°F | Ht 68.5 in | Wt 165.0 lb

## 2010-09-06 DIAGNOSIS — J40 Bronchitis, not specified as acute or chronic: Secondary | ICD-10-CM

## 2010-09-06 MED ORDER — MOXIFLOXACIN HCL 400 MG PO TABS: 400.0000 mg | ORAL_TABLET | Freq: Every day | ORAL | Status: AC

## 2010-09-06 NOTE — Progress Notes (Signed)
  Subjective:    Patient ID: Benjamin Owen, male    DOB: 1975-10-11, 35 y.o.   MRN: [id number]  HPI Here for one week of sinus pressure, PND, ST, coughing up green sputum, and fever to 100 degrees. Using fluids and Motrin.    Review of Systems  Constitutional: Positive for fever.  HENT: Positive for congestion and sinus pressure.   Eyes: Negative.   Respiratory: Positive for cough.        Objective:   Physical Exam  Constitutional: He appears well-developed and well-nourished. No distress.  HENT:  Head: Normocephalic and atraumatic.  Right Ear: External ear normal.  Left Ear: External ear normal.  Nose: Nose normal.  Mouth/Throat: Oropharynx is clear and moist. No oropharyngeal exudate.  Eyes: Conjunctivae and EOM are normal. Pupils are equal, round, and reactive to light.  Pulmonary/Chest: Breath sounds normal. No respiratory distress. He has no wheezes. He has no rales. He exhibits no tenderness.  Lymphadenopathy:    He has no cervical adenopathy.          Assessment & Plan:  Recheck prn

## 2010-09-27 ENCOUNTER — Other Ambulatory Visit: Payer: Self-pay | Admitting: Family Medicine

## 2010-11-08 ENCOUNTER — Telehealth: Payer: Self-pay | Admitting: Family Medicine

## 2010-11-08 MED ORDER — MONTELUKAST SODIUM 10 MG PO TABS: 10.0000 mg | ORAL_TABLET | Freq: Every day | ORAL | Status: DC

## 2010-11-08 NOTE — Telephone Encounter (Signed)
Rx refill. Done.  

## 2010-11-09 ENCOUNTER — Encounter: Payer: Self-pay | Admitting: Family Medicine

## 2010-11-09 ENCOUNTER — Ambulatory Visit (INDEPENDENT_AMBULATORY_CARE_PROVIDER_SITE_OTHER): Payer: BC Managed Care – PPO | Admitting: Family Medicine

## 2010-11-09 VITALS — BP 126/86 | HR 74 | Temp 98.3°F | Resp 14 | Wt 167.3 lb

## 2010-11-09 DIAGNOSIS — J029 Acute pharyngitis, unspecified: Secondary | ICD-10-CM

## 2010-11-09 MED ORDER — CEPHALEXIN 500 MG PO CAPS: 500.0000 mg | ORAL_CAPSULE | Freq: Three times a day (TID) | ORAL | Status: AC

## 2010-11-09 NOTE — Progress Notes (Signed)
  Subjective:    Patient ID: Benjamin Owen, male    DOB: [birth year], 35 y.o.   MRN: [id number]  HPI Here for 3 days of a frontal HA and a bad ST. No fever or cough. No NVD. His wife and his child were treated for strep throats over the past few weeks.    Review of Systems  Constitutional: Negative.   HENT: Positive for sore throat. Negative for postnasal drip and sinus pressure.   Eyes: Negative.   Respiratory: Negative.        Objective:   Physical Exam  Constitutional: He appears well-developed and well-nourished.  HENT:  Right Ear: External ear normal.  Left Ear: External ear normal.  Nose: Nose normal.       Posterior OP is red and covered with exudate   Eyes: Conjunctivae are normal. Pupils are equal, round, and reactive to light.  Pulmonary/Chest: Effort normal and breath sounds normal.  Lymphadenopathy:    He has cervical adenopathy.          Assessment & Plan:  This is almost surely a strep throat. Add Motrin for comfort.

## 2010-11-21 ENCOUNTER — Telehealth: Payer: Self-pay | Admitting: *Deleted

## 2010-11-21 ENCOUNTER — Other Ambulatory Visit: Payer: Self-pay | Admitting: *Deleted

## 2010-11-21 MED ORDER — FEXOFENADINE HCL 180 MG PO TABS: 180.0000 mg | ORAL_TABLET | Freq: Every day | ORAL | Status: DC

## 2010-11-21 NOTE — Telephone Encounter (Signed)
Refill request for Fexofenadine 180mg  tab OK per Dr Clent Ridges Rx sent to pharmacy

## 2010-11-25 NOTE — Op Note (Signed)
NAMEJAKEB, LAMPING NO.:  [id number]   MEDICAL RECORD NO.:  [id number]          PATIENT TYPE:  INP   LOCATION:  2550                         FACILITY:  [facility]   PHYSICIAN:  Cherylynn Ridges, M.D.    DATE OF BIRTH:  April 12, 1976   DATE OF PROCEDURE:  12/25/2005  DATE OF DISCHARGE:                                 OPERATIVE REPORT   PREOPERATIVE DIAGNOSIS:  Acute appendicitis.   POSTOPERATIVE DIAGNOSIS:  Early acute appendicitis.   PROCEDURE:  Laparoscopic appendectomy.   SURGEON:  Cherylynn Ridges, M.D.   ANESTHESIA:  General endotracheal.0.   ESTIMATED BLOOD LOSS:  Less than 20 mL.   COMPLICATIONS:  None.   CONDITION:  Stable.   SPECIMEN:  Appendix to pathology.   INDICATIONS FOR OPERATION:  The patient is a 35 year old with a CT-diagnosed  acute appendicitis and a 2-day history of right lower quadrant abdominal  pain.  He was brought to surgery for a laparoscopic appendectomy.   FINDINGS:  The patient had early acute appendicitis near the midportion and  distal portion of the appendix.  There was no evidence of perforation.   OPERATION:  The patient was taken to the operating room and placed on the  table in supine position.  After an adequate endotracheal anesthetic was  administered, he was prepped and draped in the usual sterile manner exposing  the midline and the right lower quadrant.  A supraumbilical curvilinear  incision was made using a #11 blade and taken down to the midline fascia.  The fascia was then incised longitudinally and Kocher clamps placed on each  edge of the fascia.  We then bluntly dissected down into the peritoneal  cavity using a Kelly clamp.  Once we were in the peritoneal cavity,  a  pursestring suture of 0 Vicryl was passed through the fascial opening and  used to secure the Hasson cannula, which was subsequently passed into the  peritoneal cavity.  We then insufflated carbon dioxide gas up to maximal  pressure of 15 mmHg.  We  placed the patient in Trendelenburg.  The left side  was tilted down.   A right upper quadrant 5 mm cannula and a suprapubic 11/12 mm cannula were  passed under direct vision into the peritoneal cavity.  Once they were in  place, we able to dissect out the appendix and make a window between the  mesoappendix and the base of cecum.  This allowed [place] to pass an Endo-GIA  using a 3.5-mm closure of blue staple across the base of the appendix and  staple it off.  We had used two 2.5-mm white staples across the mesoappendix  in order to secure vascular control there and detach the appendix.  Then  once the appendix was detached, we brought it out through the suprapubic  incision site using an EndoCatch bag.  Once this was done, we irrigated the  right lower quadrant with about 750 mL of saline solution.  There was no  bleeding noted.  We aspirated all fluid and gas and removed all cannulas.   The supraumbilical fascia was closed using a  figure-of-eight pursestring  suture which was in place to hold in the Hasson cannula.  We then injected  0.25% Marcaine with epinephrine at all sites and then closed the skin using  a running subcuticular stitch of 5-0 Monocryl.  All needle counts, sponge  counts and instrument counts were correct at the conclusion of the case.  Sterile dressings were applied.      Cherylynn Ridges, M.D.  Electronically Signed     JOW/MEDQ  D:  12/25/2005  T:  12/26/2005  Job:  [id number]

## 2010-11-25 NOTE — H&P (Signed)
Benjamin Owen, LEINWEBER NO.:  [id number]   MEDICAL RECORD NO.:  [id number]          PATIENT TYPE:  EMS   LOCATION:  MAJO                         FACILITY:  [facility]   PHYSICIAN:  Cherylynn Ridges, M.D.    DATE OF BIRTH:  September 05, 1975   DATE OF ADMISSION:  12/25/2005  DATE OF DISCHARGE:                                HISTORY & PHYSICAL   CHIEF COMPLAINT:  The patient is a 35 year old whose CT scan demonstrated  likely acute appendicitis, who comes in for an appendectomy.   HISTORY OF PRESENT ILLNESS:  The patient states being ill since Saturday  evening.  He has had abdominal pain, that pretty much started on the right  side associated with nausea and multiple episodes of vomiting.  He has had  no fevers or chills, but his pain persisted throughout the day.  He went in  to see his primary care physician who set him up for a CT scan.  This  demonstrated acute appendicitis by CT criteria; and he was sent over to  [facility] Emergency Room for evaluation.  His past medical history  is unremarkable.  He has had tonsils; and, I think, wisdom teeth removed  before.   ALLERGIES:  He has no known drug allergies.   MEDICATIONS:  He takes no medications chronically.   SOCIAL HISTORY:  He is a nonsmoker, nondrinker.  Otherwise healthy, works as  a Artist.   REVIEW OF SYSTEMS:  Unremarkable.   FAMILY HISTORY:  Noncontributory.   On examination he has a white count that is 7200 without a left shift.  His  hemoglobin is 15.1, hematocrit 44.2.  His electrolytes were all within  normal limits.   PHYSICAL EXAMINATION:  VITAL SIGNS:  Demonstrate a temperature of 99.0,  blood pressure 140/79, respirations of 18, pulse of 97.  HEENT:  He is normocephalic and atraumatic.  GENERAL:  He is a little bit cold and clammy.  NECK:  Supple.  CHEST: Clear to auscultation.  CARDIAC EXAM:  Regular rhythm and rate.  ABDOMEN:  Soft and tender just in the right lower  quadrant without rebound;  some mild voluntary guarding.  No diffuse peritonitis.  He has hypoactive  bowel sounds.  RECTAL EXAMINATION:  Not performed.   LABORATORY STUDIES:  As given previously.   IMPRESSION:  Acute appendicitis, not ruptured by CT.   PLAN:  The plan is to do a laparoscopic appendectomy.  I am not sure that if  I had seen this patient without the CT scan, I would have made the diagnosis  of acute appendicitis; but based on the CT scan, I think, that we should go  ahead with a laparoscopic appendectomy, possible open procedure.      Cherylynn Ridges, M.D.  Electronically Signed     JOW/MEDQ  D:  12/25/2005  T:  12/25/2005  Job:  [id number]   cc:   Jeannett Senior A. Clent Ridges, M.D. [facility]  [address] [place]  [place] [address]

## 2010-12-14 ENCOUNTER — Ambulatory Visit (INDEPENDENT_AMBULATORY_CARE_PROVIDER_SITE_OTHER): Payer: BC Managed Care – PPO | Admitting: Family Medicine

## 2010-12-14 ENCOUNTER — Encounter: Payer: Self-pay | Admitting: Family Medicine

## 2010-12-14 VITALS — BP 120/82 | HR 71 | Temp 98.7°F | Resp 12 | Wt 170.0 lb

## 2010-12-14 DIAGNOSIS — Z112 Encounter for screening for other bacterial diseases: Secondary | ICD-10-CM

## 2010-12-14 DIAGNOSIS — J069 Acute upper respiratory infection, unspecified: Secondary | ICD-10-CM

## 2010-12-14 DIAGNOSIS — K219 Gastro-esophageal reflux disease without esophagitis: Secondary | ICD-10-CM

## 2010-12-14 NOTE — Progress Notes (Signed)
  Subjective:    Patient ID: Benjamin Owen, male    DOB: Jun 21, 1976, 35 y.o.   MRN: [id number]  HPI Here for 3 days of HA, PND, and a ST. No fever or cough. No mucus from the nose. He is using Tylenol and Mucinex. Also he is having worse heartburn and other GERD symptoms despite taking Nexium bid . He last saw Dr. Jarold Motto 3 years ago for an upper endoscopy.    Review of Systems  Constitutional: Negative.   HENT: Positive for congestion, sore throat, postnasal drip and sinus pressure.   Eyes: Negative.   Respiratory: Negative.   Gastrointestinal: Negative.        Objective:   Physical Exam  Constitutional: He appears well-developed and well-nourished.  HENT:  Right Ear: External ear normal.  Left Ear: External ear normal.  Nose: Nose normal.  Mouth/Throat: Oropharynx is clear and moist. No oropharyngeal exudate.  Eyes: Conjunctivae are normal. Pupils are equal, round, and reactive to light.  Neck: No thyromegaly present.  Pulmonary/Chest: Effort normal and breath sounds normal.  Abdominal: Soft. Bowel sounds are normal. He exhibits no distension and no mass. There is no tenderness. There is no rebound and no guarding.  Lymphadenopathy:    He has no cervical adenopathy.          Assessment & Plan:  This is a viral infection. Continue with Mucinex and Tylenol. Since his GERD is poorly controlled, I asked him to see Dr. Jarold Motto again.

## 2010-12-21 ENCOUNTER — Encounter: Payer: Self-pay | Admitting: *Deleted

## 2010-12-29 ENCOUNTER — Encounter: Payer: Self-pay | Admitting: Gastroenterology

## 2010-12-29 ENCOUNTER — Ambulatory Visit (INDEPENDENT_AMBULATORY_CARE_PROVIDER_SITE_OTHER): Payer: BC Managed Care – PPO | Admitting: Gastroenterology

## 2010-12-29 ENCOUNTER — Other Ambulatory Visit (INDEPENDENT_AMBULATORY_CARE_PROVIDER_SITE_OTHER): Payer: BC Managed Care – PPO

## 2010-12-29 VITALS — BP 118/64 | HR 88 | Ht 69.0 in | Wt 167.0 lb

## 2010-12-29 DIAGNOSIS — R1031 Right lower quadrant pain: Secondary | ICD-10-CM

## 2010-12-29 DIAGNOSIS — Z1211 Encounter for screening for malignant neoplasm of colon: Secondary | ICD-10-CM

## 2010-12-29 DIAGNOSIS — K219 Gastro-esophageal reflux disease without esophagitis: Secondary | ICD-10-CM

## 2010-12-29 DIAGNOSIS — G8929 Other chronic pain: Secondary | ICD-10-CM

## 2010-12-29 LAB — VITAMIN B12: Vitamin B-12: 451 pg/mL (ref 211–911)

## 2010-12-29 LAB — URINALYSIS
Bilirubin Urine: NEGATIVE
Ketones, ur: NEGATIVE
Leukocytes, UA: NEGATIVE
Nitrite: NEGATIVE
Urobilinogen, UA: 0.2 (ref 0.0–1.0)
pH: 7.5 (ref 5.0–8.0)

## 2010-12-29 LAB — CBC WITH DIFFERENTIAL/PLATELET
Basophils Relative: 0.1 % (ref 0.0–3.0)
Eosinophils Relative: 1.2 % (ref 0.0–5.0)
HCT: 43.9 % (ref 39.0–52.0)
Hemoglobin: 15.2 g/dL (ref 13.0–17.0)
Lymphocytes Relative: 45 % (ref 12.0–46.0)
Lymphs Abs: 2.6 10*3/uL (ref 0.7–4.0)
MCV: 86.8 fl (ref 78.0–100.0)
Monocytes Relative: 9.6 % (ref 3.0–12.0)
Neutrophils Relative %: 44.1 % (ref 43.0–77.0)
RBC: 5.05 Mil/uL (ref 4.22–5.81)
WBC: 5.7 10*3/uL (ref 4.5–10.5)

## 2010-12-29 LAB — BASIC METABOLIC PANEL
CO2: 27 mEq/L (ref 19–32)
GFR: 102.8 mL/min (ref >60.00)
Sodium: 137 mEq/L (ref 135–145)

## 2010-12-29 LAB — TSH: TSH: 1.19 u[IU]/mL (ref 0.35–5.50)

## 2010-12-29 LAB — HEPATIC FUNCTION PANEL
Albumin: 4.8 g/dL (ref 3.5–5.2)
Alkaline Phosphatase: 65 U/L (ref 39–117)
Total Protein: 7.2 g/dL (ref 6.0–8.3)

## 2010-12-29 LAB — FOLATE: Folate: 21.4 ng/mL (ref >5.9)

## 2010-12-29 LAB — FERRITIN: Ferritin: 40.1 ng/mL (ref 22.0–322.0)

## 2010-12-29 LAB — IBC PANEL
Saturation Ratios: 41.4 % (ref 20.0–50.0)
Transferrin: 264.1 mg/dL (ref 212.0–360.0)

## 2010-12-29 MED ORDER — DEXLANSOPRAZOLE 60 MG PO CPDR: 60.0000 mg | DELAYED_RELEASE_CAPSULE | Freq: Every day | ORAL | Status: DC

## 2010-12-29 MED ORDER — HYOSCYAMINE SULFATE 0.125 MG SL SUBL: 0.1250 mg | SUBLINGUAL_TABLET | SUBLINGUAL | Status: AC | PRN

## 2010-12-29 MED ORDER — SUCRALFATE 1 GM/10ML PO SUSP: 1.0000 g | Freq: Four times a day (QID) | ORAL | Status: DC

## 2010-12-29 NOTE — Progress Notes (Signed)
This is a 35 year old Caucasian male with chronic GERD only partially relieved with Nexium 20 mg twice a day. Previous endoscopy was unremarkable as were biopsies showed no evidence of Barrett's mucosa or eosinophilic esophagitis. His main complaint is periodic right lower quadrant sharp pain occurring several times a day without precipitating or alleviating elements. He has discontinued caffeine-containing beverages to no avail. He has regular bowel movements without melena, hematochezia, denies any hepatobiliary complaints, melena, hematochezia, or systemic complaints. He does not abuse alcohol, cigarettes, or NSAIDs. Family history is noncontributory. He denies any history of known kidney stones. He does have chronic migraine headaches and takes Relpax 40 mg a day and Topamax 25 mg twice a day.  Current Medications, Allergies, Past Medical History, Past Surgical History, Family History and Social History were reviewed in [employer] record.   Pertinent Review of Systems Negative  Physical Exam: Awake and alert in no acute distress appearing his stated age. I cannot appreciate stigmata of chronic liver disease. Chest is clear there are no murmurs gallops or rubs noted. There is no hepatosplenomegaly, no masses, tenderness, or evidence of any herniations. Bowel sounds are normal and mental status is normal.    Assessment and Plan: His right sided abdominal pain always seems consistent with recurrent nephrolithiasis. I cannot really get any GI relationship to suggest IBS. I have ordered a urinalysis and repeat labs and we'll see him back in several weeks' time for followup. He is to use when necessary sublingual Levsin as needed. I have changed him from Nexium to Dexilant 60 mg every morning per acid reflux breakthrough problems. Encounter Diagnosis  Name Primary?  . Esophageal reflux Yes

## 2010-12-29 NOTE — Patient Instructions (Signed)
We have given you Dexilant samples to take once a day, if they work well call back and we will send an rx. Please go to the basement today for your labs.  Your prescription(s) have been sent to you pharmacy.

## 2010-12-30 LAB — CELIAC PANEL 10
Endomysial Screen: NEGATIVE
Gliadin IgA: 3.9 U/mL (ref <20)
Tissue Transglut Ab: 8.6 U/mL (ref <20)
Tissue Transglutaminase Ab, IgA: 3.7 U/mL (ref <20)

## 2011-01-04 ENCOUNTER — Telehealth: Payer: Self-pay | Admitting: Gastroenterology

## 2011-01-04 MED ORDER — PRAMOXINE-HC 1-1 % EX CREA: TOPICAL_CREAM | CUTANEOUS | Status: DC

## 2011-01-04 NOTE — Telephone Encounter (Signed)
Pt with hx of GERD, previous ENDO unremarkable, and periodic RLQ pain. Last OV 12/29/10 for pain. New meds ordered were Dexilant and Levsin, but pt had Nexium left, so he hasn't started either med. Today, pt c/o hemorrhoids that started last Friday, 12/30/10. He stated he's normally regular, no problems, but his stools have been more hard and not as much. He started on stool softeners QD but he doesn't feel as though he is emptying his bowels and he feels pressure in his rectum. Pt reports his diet hasn't changed except he ran out of [place] bars which are high in fiber. Instructed pt to increase his stool softeners to bid, increase fiber in his diet. Dr Jarold Motto, can pt have Anusol or something else for his hemorrhoids? Thanks.

## 2011-01-04 NOTE — Telephone Encounter (Signed)
ANALPRAM CREAM IS OK

## 2011-01-04 NOTE — Telephone Encounter (Signed)
Notified pt I ordered Analpram cream to [facility].

## 2011-01-05 ENCOUNTER — Ambulatory Visit (INDEPENDENT_AMBULATORY_CARE_PROVIDER_SITE_OTHER): Payer: BC Managed Care – PPO | Admitting: Gastroenterology

## 2011-01-05 ENCOUNTER — Encounter: Payer: Self-pay | Admitting: Gastroenterology

## 2011-01-05 ENCOUNTER — Other Ambulatory Visit: Payer: Self-pay | Admitting: Family Medicine

## 2011-01-05 ENCOUNTER — Telehealth: Payer: Self-pay | Admitting: Gastroenterology

## 2011-01-05 VITALS — BP 108/74 | HR 80 | Ht 69.0 in | Wt 162.0 lb

## 2011-01-05 DIAGNOSIS — K219 Gastro-esophageal reflux disease without esophagitis: Secondary | ICD-10-CM | POA: Insufficient documentation

## 2011-01-05 DIAGNOSIS — K602 Anal fissure, unspecified: Secondary | ICD-10-CM

## 2011-01-05 MED ORDER — LIDOCAINE HCL 2 % EX GEL: CUTANEOUS | Status: DC | PRN

## 2011-01-05 MED ORDER — DOCUSATE SODIUM 100 MG PO CAPS: 100.0000 mg | ORAL_CAPSULE | Freq: Two times a day (BID) | ORAL | Status: DC

## 2011-01-05 MED ORDER — TRAMADOL HCL 50 MG PO TABS: 50.0000 mg | ORAL_TABLET | Freq: Four times a day (QID) | ORAL | Status: DC | PRN

## 2011-01-05 MED ORDER — LORATADINE 10 MG PO TABS: 10.0000 mg | ORAL_TABLET | Freq: Every day | ORAL | Status: DC

## 2011-01-05 MED ORDER — MESALAMINE 1000 MG RE SUPP: 1000.0000 mg | Freq: Every day | RECTAL | Status: DC

## 2011-01-05 NOTE — Telephone Encounter (Signed)
Pt called again this am and stated his pain is worse. On his visit to see Dr Jarold Motto he was only seen for reflux like s&s . He stated the pressure is becoming unbearable. Dr Jarold Motto agreed to see the pt if he's here at 0900. Pt stated understanding.

## 2011-01-05 NOTE — Progress Notes (Signed)
This is a 35-year-old Caucasian male he recently had endoscopic exam for chronic GERD. He has had one week of tenesmus and rectal discomfort but no fever, chills, rectal bleeding. He has had mild constipation alleviated by stimulant laxatives. He gives a past history of rectal or colonic problems. Currently is on Nexium 20 mg twice a day and several medications for her migraine headaches. There's no family history of known inflammatory bowel disease.  Current Medications, Allergies, Past Medical History, Past Surgical History, Family History and Social History were reviewed in [employer] record.  Pertinent Revie w of Systems Negative  Physical Exam: Awake alert no acute distress. Abdominal exam is unremarkable. Rectal rectum shows no definite fissure or fistula. Rectum is extremely sore to touch with up also posterior fissure on palpation. I cannot appreciate any fluctuation or mass lesions in the rectum or perirectal area. Stool was normal color, soft, guaiac negative.    Assessment and Plan: Posterior rectal fissure associated pain and tenesmus. I placed him on Canasa 1 g suppositories at bedtime with frequent 5% local Xylocaine gel. He is to continue stool softeners, we will add fiber supplements, and treated with when necessary tramadol 50 mg every 6-8 hours as needed for pain. In the past has been intolerant to codeine products. I will see him back in one week's time for followup. No diagnosis found.

## 2011-01-05 NOTE — Patient Instructions (Signed)
Your prescription(s) have been sent to you pharmacy.  Buy Benefiber OTC and take once a day. Come back to see Dr Jarold Motto on 01/16/2011 at 10am, if you can not keep this appt please make sure you give 24 hour notice to advoid a $50 cx fee. Bring a list of all of your medications, insurance card and copay.

## 2011-01-05 NOTE — Telephone Encounter (Signed)
Answered questions about meds

## 2011-01-09 ENCOUNTER — Telehealth: Payer: Self-pay | Admitting: Gastroenterology

## 2011-01-09 NOTE — Telephone Encounter (Signed)
lmom to call back 

## 2011-01-09 NOTE — Telephone Encounter (Signed)
Pt called to ask if he should be feeling so much pressure in his rectum. He is having trouble sitting and sleeping [place] He is using xylocaine and the Canasa suppositories; should he be using An Analpram also? Informed him to use all three and asked about Tramadol. Pt stated the Tramadol causes nausea so he's taking Ibuprofen. Pt will add the Analpram and call for further problems.

## 2011-01-11 ENCOUNTER — Telehealth: Payer: Self-pay | Admitting: Gastroenterology

## 2011-01-11 NOTE — Telephone Encounter (Signed)
Still having anal pain.  Not taking tramadol.  Also not doing sitz baths.    i recommended he stay on topical anal treatments tid after tid sitz baths and tramadol 2-3 times a day.  He is pretty miserable from the fissure, due for f/u with DRP next week.

## 2011-01-13 ENCOUNTER — Telehealth: Payer: Self-pay | Admitting: Gastroenterology

## 2011-01-13 MED ORDER — AMBULATORY NON FORMULARY MEDICATION: Status: DC

## 2011-01-13 NOTE — Telephone Encounter (Signed)
Pt reports he's feeling better, but he needs another pain med. He reports the ultram is working, but it makes him sick- nausea, light headed, dizzy, etc. Spoke with Dr Marina Goodell, supv. MD, who ordered Diltiazem 2% gel up to 5 x daily to fissure area. He should also take 2 tbsp metamucil in large glass of liquids daily also. Pt stated understanding- order placed to [facility].

## 2011-01-16 ENCOUNTER — Encounter: Payer: Self-pay | Admitting: Gastroenterology

## 2011-01-16 ENCOUNTER — Ambulatory Visit (INDEPENDENT_AMBULATORY_CARE_PROVIDER_SITE_OTHER): Payer: BC Managed Care – PPO | Admitting: Gastroenterology

## 2011-01-16 VITALS — BP 124/76 | HR 84 | Ht 68.5 in | Wt 164.0 lb

## 2011-01-16 DIAGNOSIS — K6289 Other specified diseases of anus and rectum: Secondary | ICD-10-CM

## 2011-01-16 DIAGNOSIS — K602 Anal fissure, unspecified: Secondary | ICD-10-CM

## 2011-01-16 NOTE — Progress Notes (Signed)
History of Present Illness: This is a  35 year old Caucasian male with continued rectal pain and discomfort, somewhat better with local anal care for an anal fissure. He's been intolerant of tramadol  and is currently taking ibuprofen for pain. He is on softeners, Analpram, and Xylocaine cream locally to his rectum. He continues with rectal discomfort but no real abdominal pain, fever, chills, or other systemic complaints. He relates he is having a regular bowel movement daily. Not had previous colonoscopy exams.    Current Medications, Allergies, Past Medical History, Past Surgical History, Family History and Social History were reviewed in [employer] record.  Exam: Abdominal exam is unremarkable. Rectal exam is very difficult cause of severe rectal spasm and tenderness. There appears to be a posterior skin tag present without any definite fistulization. I did not perform digital exam at this time.   Assessment and plan: Anal fissure--rule out perirectal abscess or fistulization. I've scheduled flexible sigmoidoscopy with propofol sedation. He may need surgical referral. For now is to continue therapy as previously outlined. Lab reviewed are normal as is sed rate. Encounter Diagnosis  Name Primary?  . Rectal pain Yes

## 2011-01-16 NOTE — Patient Instructions (Signed)
Your procedure has been scheduled for 01/17/2011, please follow the seperate instructions.

## 2011-01-17 ENCOUNTER — Encounter: Payer: Self-pay | Admitting: Gastroenterology

## 2011-01-17 ENCOUNTER — Ambulatory Visit (AMBULATORY_SURGERY_CENTER): Payer: BC Managed Care – PPO | Admitting: Gastroenterology

## 2011-01-17 DIAGNOSIS — K6289 Other specified diseases of anus and rectum: Secondary | ICD-10-CM

## 2011-01-17 DIAGNOSIS — K602 Anal fissure, unspecified: Secondary | ICD-10-CM

## 2011-01-17 MED ORDER — SODIUM CHLORIDE 0.9 % IV SOLN: 500.0000 mL | INTRAVENOUS | Status: DC

## 2011-01-17 NOTE — Progress Notes (Signed)
PT ARRIVED FOR HIS PROCEDURE WITH AN ENEMA IN HIS BAG. DID NOT DO ENEMA AT HOME.STATES DIDN'T WANT TO DO IT AT ALL IF NOT NECESSARY.  PT DID HAVE BOWEL MOVEMENT IN OFFICE AND WAS YELLOW LIQUID MUSHY STOOL. PT INSTRUCTED NEEDING TO DO ENEMA NOW IN OFFICE. PT INSTRUCTED ON ENEMA PROCEDURE AND WAS PERFORMED. RESULTS WERE GOOD WITH YELLOW ALMOST CLEAR LIQUID LITTLE AMT STOOL IN  BOWL AFTER THE ENEMA. PT TOLERATED ENEMA WELL, BUT DID VOICE  AGAIN CONCERN ABOUT THE ENEMA AND NOT WANTING TO DO IT AT ALL.AT FIRST DUE TO THE ? FISSURE OR HEMORRHOID PROBLEM HE'S HAVING. E Shreyan Hinz RN

## 2011-01-17 NOTE — Progress Notes (Signed)
Patient's diltazam was called into [facility] pharmacy per Dr. Norval Gable CMA.

## 2011-01-18 ENCOUNTER — Telehealth: Payer: Self-pay

## 2011-01-18 ENCOUNTER — Telehealth: Payer: Self-pay | Admitting: Gastroenterology

## 2011-01-18 NOTE — Telephone Encounter (Signed)
Follow up Call- Patient questions:  Do you have a fever, pain , or abdominal swelling? no Pain Score  0 *  Have you tolerated food without any problems? yes  Have you been able to return to your normal activities? yes  Do you have any questions about your discharge instructions: Diet   no Medications  yes reviewed instructions for dilitiazem gel Follow up visit  no  Do you have questions or concerns about your Care? no  Actions: * If pain score is 4 or above: No action needed, pain <4.

## 2011-01-18 NOTE — Telephone Encounter (Signed)
Answered patient questions regarding new medication diltiazem gel. Encouraged patient to call us back if no improvement with this new med. He understands. Sherren Kerns

## 2011-01-27 ENCOUNTER — Telehealth: Payer: Self-pay | Admitting: Family Medicine

## 2011-01-27 NOTE — Telephone Encounter (Signed)
Pt is requesting a script- he has a sore throat, headache and not feeling well. His daughter has strep and is on antibiotics. Can we call something in?

## 2011-01-30 NOTE — Telephone Encounter (Signed)
Call in Keflex 500 mg tid for 10 days  

## 2011-01-30 NOTE — Telephone Encounter (Signed)
I called in script and pt aware. 

## 2011-02-01 ENCOUNTER — Ambulatory Visit (INDEPENDENT_AMBULATORY_CARE_PROVIDER_SITE_OTHER): Payer: BC Managed Care – PPO | Admitting: Family Medicine

## 2011-02-01 ENCOUNTER — Encounter: Payer: Self-pay | Admitting: Family Medicine

## 2011-02-01 ENCOUNTER — Telehealth: Payer: Self-pay | Admitting: Gastroenterology

## 2011-02-01 VITALS — BP 116/74 | HR 112 | Temp 98.7°F | Wt 160.0 lb

## 2011-02-01 DIAGNOSIS — G4700 Insomnia, unspecified: Secondary | ICD-10-CM

## 2011-02-01 DIAGNOSIS — R51 Headache: Secondary | ICD-10-CM

## 2011-02-01 DIAGNOSIS — G8929 Other chronic pain: Secondary | ICD-10-CM

## 2011-02-01 DIAGNOSIS — F411 Generalized anxiety disorder: Secondary | ICD-10-CM

## 2011-02-01 DIAGNOSIS — F419 Anxiety disorder, unspecified: Secondary | ICD-10-CM

## 2011-02-01 MED ORDER — TEMAZEPAM 30 MG PO CAPS: 30.0000 mg | ORAL_CAPSULE | Freq: Every evening | ORAL | Status: AC | PRN

## 2011-02-01 MED ORDER — AMBULATORY NON FORMULARY MEDICATION: Status: DC

## 2011-02-01 NOTE — Progress Notes (Signed)
  Subjective:    Patient ID: Benjamin Owen, male    DOB: 04/16/1976, 35 y.o.   MRN: [id number]  HPI Here to discuss some anxiety and insomnia recently that he attributes to a number of health issues. His migraines had been under good control until about 2 months ago when they started to come more frequently. Also he developed a bad anal fissure, and he has been dealing with a lot of rectal pain since then. He cannot sleep much, and this has made him feel exhausted all the time. He denies any depression.    Review of Systems  Constitutional: Negative.   Respiratory: Negative.   Cardiovascular: Negative.   Gastrointestinal: Positive for anal bleeding and rectal pain.  Neurological: Positive for headaches.  Psychiatric/Behavioral: Positive for confusion, sleep disturbance and decreased concentration. The patient is nervous/anxious.        Objective:   Physical Exam  Constitutional: He is oriented to person, place, and time. He appears well-developed and well-nourished.  HENT:  Right Ear: External ear normal.  Left Ear: External ear normal.  Nose: Nose normal.  Mouth/Throat: Oropharynx is clear and moist.  Eyes: Conjunctivae are normal. Pupils are equal, round, and reactive to light.  Neck: Normal range of motion. Neck supple. No thyromegaly present.  Cardiovascular: Normal rate, regular rhythm, normal heart sounds and intact distal pulses.   Musculoskeletal: Normal range of motion. He exhibits no edema and no tenderness.  Lymphadenopathy:    He has no cervical adenopathy.  Neurological: He is alert and oriented to person, place, and time. No cranial nerve deficit.  Psychiatric: He has a normal mood and affect. His behavior is normal. Thought content normal.          Assessment & Plan:  The generalized anxiety does seem to be coming as a result of his other health issues. We decided to not treat this with meds right now, but we can if needed in the future. Try Temazepam for  sleep, and I think better quality sleep will make him feel better all around

## 2011-02-01 NOTE — Telephone Encounter (Signed)
Pt reports he thought his fissure was better, but the last couple of days he has begun hurting more. He is out of the Diltiazem. Pt wants to know if he needs to be seen and can he get a refill on Diltiazem? Please advise.

## 2011-02-01 NOTE — Telephone Encounter (Signed)
Pt reports he was getting better, had cut down his stool softeners to once daily instead of twice. He also had a hard BM that may have aggravated the fissure again. Refilled the Diltiazem; he will call for further problems.

## 2011-02-01 NOTE — Telephone Encounter (Signed)
lmom for pt to call  °

## 2011-02-01 NOTE — Telephone Encounter (Signed)
Refill rx,,,

## 2011-02-02 ENCOUNTER — Telehealth: Payer: Self-pay | Admitting: Family Medicine

## 2011-02-02 NOTE — Telephone Encounter (Signed)
Pt was in yesterday and spoke with Dr. Clent Ridges about depression. Pt says that he sugar coated his condition and is having problems functioning day to day. Please contact pt.

## 2011-02-03 NOTE — Telephone Encounter (Signed)
Please call him now

## 2011-02-03 NOTE — Telephone Encounter (Signed)
Call in Lorazepam 1 mg q 6 hours prn anxiety, #60 with no rf.

## 2011-02-03 NOTE — Telephone Encounter (Signed)
Script called in and left message for pt. 

## 2011-02-03 NOTE — Telephone Encounter (Signed)
Spoke with pt, he is feeling depressed, angry and sick on stomach. He would like something for his anxiety.

## 2011-02-03 NOTE — Telephone Encounter (Signed)
Pt having extreme anxiety and emotional issues. Is req that Dr Clent Ridges call asap this morning. Very Urgent. Pt leaving to go out of town today. Pls call.

## 2011-02-07 ENCOUNTER — Telehealth: Payer: Self-pay | Admitting: Gastroenterology

## 2011-02-07 DIAGNOSIS — K602 Anal fissure, unspecified: Secondary | ICD-10-CM

## 2011-02-07 MED ORDER — DEXLANSOPRAZOLE 60 MG PO CPDR: 60.0000 mg | DELAYED_RELEASE_CAPSULE | Freq: Every day | ORAL | Status: DC

## 2011-02-07 NOTE — Telephone Encounter (Signed)
He needs surgical referral..Marland Kitchen

## 2011-02-07 NOTE — Telephone Encounter (Signed)
Informed pt Dr Jarold Motto wants to refer him to a surgeon for his fissure. Pt  agreed to the referral; states he thinks he's taken a step backward in healing. He thinks he's doing all he can do by Sitz baths, using the Diltiazem cream and using stool softeners. He asked if it's ok to use Miralax and I informed him he could and it would keep his BM's soft. I asked if he was using Lidocaine to dull the pain and he stated he will start. Scheduled an appt with Dr Michaell Cowing for 02/16/11, arrive 3:10pm for a 3:40pm appt. Informed pt of his appt. He stated he ran out of Dexilant and went back to the Nexium and he doesn't think the Nexium is working as well. He also reports an increase in anxiety. Talked with pt and informed him I will order the Dexilant and asked him not to stress over the fissure. Maybe the surgeon can reinforce to him, it'll just take time to heal. Again, asked him to try the Lidocaine to numb the area and decrease his pain. Pt to call for further problems.

## 2011-02-07 NOTE — Telephone Encounter (Signed)
lmom to call back 

## 2011-02-07 NOTE — Telephone Encounter (Addendum)
Pt states he's no better. Pain when he sits, walks, any movement causes pain- keeps him from activities. Better than when he was initially dx, but not gone. He's taking/eating fiber and using stool softeners bid. Does he need to be seen this pm or any suggestions?

## 2011-02-16 ENCOUNTER — Telehealth: Payer: Self-pay | Admitting: Family Medicine

## 2011-02-16 ENCOUNTER — Encounter (INDEPENDENT_AMBULATORY_CARE_PROVIDER_SITE_OTHER): Payer: Self-pay | Admitting: Surgery

## 2011-02-16 ENCOUNTER — Ambulatory Visit (INDEPENDENT_AMBULATORY_CARE_PROVIDER_SITE_OTHER): Payer: BC Managed Care – PPO | Admitting: Surgery

## 2011-02-16 VITALS — BP 140/78 | HR 95 | Ht 68.5 in | Wt 158.4 lb

## 2011-02-16 DIAGNOSIS — K6289 Other specified diseases of anus and rectum: Secondary | ICD-10-CM

## 2011-02-16 DIAGNOSIS — K602 Anal fissure, unspecified: Secondary | ICD-10-CM

## 2011-02-16 NOTE — Telephone Encounter (Signed)
I think it would be fine to try Paxil. It is a safe drug, and we all prescribe it once in awhile. Go ahead and start on it, but follow up with me in a month to talk about it

## 2011-02-16 NOTE — Patient Instructions (Signed)
See Handout on fissure & hemorrhoids

## 2011-02-16 NOTE — Progress Notes (Signed)
Subjective:     Patient ID: Benjamin Owen, male   DOB: 1975/11/17, 35 y.o.   MRN: [id number]  HPI  Reason for visit: Anal pain. Probable fissure.  Patient is a young anxious male who has been struggling with anal pain since late June 2012. He thinks it happened after hard bowel movement. He has problems with reflux. He change his diet. That alternated things. He normally has a bowel movement about every day. He's been seeing his primary care physician and his gastroenterologist. They have tried suppositories. They have tried Xylocaine cream. They tried Analpram cream.  He had a sigmoidoscopy July 13. It showed a probable fissure. He was started on diltiazem cream. It seemed to help. Then he occasionally get worsening pain.  He notes that defecating is not particularly painful.  It was in the past.  He gets anal irritation afterwards. It is sore to sit down. He rarely has any blood. He was told one point he had a skin tag back there, but he cannot feel it. He's been taking MiraLax and some stool softeners to help out. He occasionally went a little bit of leakage and irritation. No major sweating.  Review of Systems  Constitutional: Negative for fever, chills and diaphoresis.  HENT: Negative for nosebleeds, sore throat, facial swelling, mouth sores, trouble swallowing and ear discharge.   Eyes: Negative for photophobia, discharge and visual disturbance.  Respiratory: Negative for choking, chest tightness, shortness of breath and stridor.   Cardiovascular: Negative for chest pain and palpitations.  Gastrointestinal: Positive for rectal pain. Negative for nausea, vomiting, abdominal pain, diarrhea, constipation, blood in stool, abdominal distention and anal bleeding.  Genitourinary: Negative for dysuria, urgency, difficulty urinating and testicular pain.  Musculoskeletal: Negative for myalgias, back pain, arthralgias and gait problem.  Skin: Negative for color change, pallor, rash and wound.    Neurological: Negative for dizziness, speech difficulty, weakness, numbness and headaches.  Hematological: Negative for adenopathy. Does not bruise/bleed easily.  Psychiatric/Behavioral: Negative for hallucinations, confusion and agitation.       Objective:   Physical Exam  Constitutional: He is oriented to person, place, and time. He appears well-developed and well-nourished. No distress.  HENT:  Head: Normocephalic.  Mouth/Throat: Oropharynx is clear and moist. No oropharyngeal exudate.  Eyes: Conjunctivae and EOM are normal. Pupils are equal, round, and reactive to light. No scleral icterus.  Neck: Normal range of motion. Neck supple. No tracheal deviation present.  Cardiovascular: Normal rate, regular rhythm and intact distal pulses.   Pulmonary/Chest: Effort normal and breath sounds normal. No respiratory distress.  Abdominal: Soft. He exhibits no distension and no mass. There is no tenderness. Hernia confirmed negative in the right inguinal area and confirmed negative in the left inguinal area.  Genitourinary: Prostate normal and penis normal. No penile tenderness.       Perianal skin clear. No active fissure. Some erythema posterior midline consistent with recent healing of a prior fissure. No fistula. Sensitivity in the anal canal. Prostate normal. Anoscopy reveals no markedly inflamed hemorrhoids. 5 mm thrombosed hemorrhoid right posterior. Not inflamed.  Musculoskeletal: Normal range of motion. He exhibits no tenderness.  Lymphadenopathy:    He has no cervical adenopathy.       Right: No inguinal adenopathy present.       Left: No inguinal adenopathy present.  Neurological: He is alert and oriented to person, place, and time. No cranial nerve deficit. He exhibits normal muscle tone. Coordination normal.  Skin: Skin is warm and dry. No  rash noted. He is not diaphoretic. No erythema. No pallor.  Psychiatric: His behavior is normal. Judgment and thought content normal.        Anxious, but consolable.       Assessment:     Probable anal fissure now healed. A small thrombosed hemorrhoid nearly resolved. Perianal sensitivity.    Plan:     At this point I do not think he needs an operation. In talking to him in further detail, it sounds like the symptoms are progressively improved.  Had a long discussion about the hemorrhoids and fissures. I gave handouts. I discussed it with his mother as well. I think he needs to have a careful regimen of MiraLax only, wet wipes, warm soaks up to 6 times a day (was doing of one to 2 times a day), avoid straining.  A recommend he continue to diltiazem cream 4 times a day for the next 2 weeks. Hopefully they'll keep things calmed down and prevent any new fissures.  If the pain gets worse or never truly resolves in a few weeks, I will offer to do examination anesthesia make sure not missing anything. However, he tolerated anoscopy and physical exam rather well. There is no major concerning issues at this time. By the end of the conversation, he felt a little more reassured. If he had a persistent fissure, I would recommend considering sphincterotomy. However it seems like the diltiazem is doing the trick for now.  Followup p.r.n. with a low threshold to reevaluate.

## 2011-02-16 NOTE — Telephone Encounter (Signed)
Pt called 8/9. The headache doctor he was referred to has made changes/put him on new meds. Pt is concerned about 2 different doctors controlling his meds without communicating. He would like you to call him because he would like to run his meds by you. The headache doctor is trying to put him on Paxil, and he is nervous & concerned about this drug. Please let me know if you would rather see the pt than call him. Thanks.

## 2011-02-17 ENCOUNTER — Other Ambulatory Visit: Payer: Self-pay | Admitting: Gastroenterology

## 2011-02-17 NOTE — Telephone Encounter (Signed)
Pt called re: status of questions re: Paxil, etc. Pt req call back today.

## 2011-02-17 NOTE — Telephone Encounter (Signed)
Spoke with pt and he is going to schedule a office visit to discuss this further.

## 2011-02-17 NOTE — Telephone Encounter (Signed)
rx already sent. 

## 2011-02-20 ENCOUNTER — Ambulatory Visit (INDEPENDENT_AMBULATORY_CARE_PROVIDER_SITE_OTHER): Payer: BC Managed Care – PPO | Admitting: Family Medicine

## 2011-02-20 ENCOUNTER — Encounter: Payer: Self-pay | Admitting: Family Medicine

## 2011-02-20 VITALS — BP 120/68 | HR 90 | Temp 98.6°F | Wt 159.0 lb

## 2011-02-20 DIAGNOSIS — F419 Anxiety disorder, unspecified: Secondary | ICD-10-CM

## 2011-02-20 DIAGNOSIS — R51 Headache: Secondary | ICD-10-CM

## 2011-02-20 DIAGNOSIS — F411 Generalized anxiety disorder: Secondary | ICD-10-CM

## 2011-02-20 MED ORDER — CITALOPRAM HYDROBROMIDE 20 MG PO TABS: 20.0000 mg | ORAL_TABLET | Freq: Every day | ORAL | Status: DC

## 2011-02-20 NOTE — Progress Notes (Signed)
  Subjective:    Patient ID: Benjamin Owen, male    DOB: 1975-08-08, 35 y.o.   MRN: [id number]  HPI Here to ask for advice about medications. He is seeing Dr. Jake Samples for headaches, and Dr. Vela Prose recently suggested him trying Paxil. Not only does Dr. Vela Prose think that Benjamin Owen has underlying anxiety that is contributing to the HAs, but also he thinks that this med has a direct effect on preventing migraines. Benjamin Owen does admit to having a lot of anxiety and possibly some mild depression as well, and he is not against treating this. However he has concerns about possible side effects of Paxil, and he is very afraid to try it.    Review of Systems  Constitutional: Negative.   Neurological: Positive for headaches. Negative for dizziness, syncope, weakness and light-headedness.  Psychiatric/Behavioral: Positive for dysphoric mood. The patient is nervous/anxious.        Objective:   Physical Exam  Constitutional: He is oriented to person, place, and time. He appears well-developed and well-nourished.  Neurological: He is alert and oriented to person, place, and time. No cranial nerve deficit. He exhibits normal muscle tone. Coordination normal.  Psychiatric: His behavior is normal. Judgment and thought content normal.       Mildly anxious           Assessment & Plan:  We agreed to treat the anxiety, and I think this will help the HAs. I would think that any SSRI would have the same effect, so instead of Paxil we will try Celexa at 20 mg daily. He will follow up with me in 2 weeks

## 2011-02-22 ENCOUNTER — Telehealth: Payer: Self-pay | Admitting: *Deleted

## 2011-02-22 NOTE — Telephone Encounter (Signed)
Pt took one Celexa today and developed a headache and increased anxiety.  He found out that his insurance will also pay for generic Lexapro.

## 2011-02-23 MED ORDER — ESCITALOPRAM OXALATE 10 MG PO TABS: 10.0000 mg | ORAL_TABLET | Freq: Every day | ORAL | Status: DC

## 2011-02-23 NOTE — Telephone Encounter (Signed)
Stop Celexa, instead call in generic Lexapro 10 mg a day, #30 with 2 rf

## 2011-05-11 ENCOUNTER — Encounter: Payer: Self-pay | Admitting: Family Medicine

## 2011-05-11 ENCOUNTER — Ambulatory Visit (INDEPENDENT_AMBULATORY_CARE_PROVIDER_SITE_OTHER): Payer: BC Managed Care – PPO | Admitting: Family Medicine

## 2011-05-11 DIAGNOSIS — N509 Disorder of male genital organs, unspecified: Secondary | ICD-10-CM

## 2011-05-11 DIAGNOSIS — R300 Dysuria: Secondary | ICD-10-CM

## 2011-05-11 DIAGNOSIS — N50819 Testicular pain, unspecified: Secondary | ICD-10-CM

## 2011-05-11 DIAGNOSIS — M549 Dorsalgia, unspecified: Secondary | ICD-10-CM

## 2011-05-11 LAB — POCT URINALYSIS DIPSTICK
Glucose, UA: NEGATIVE
Leukocytes, UA: NEGATIVE
Nitrite, UA: NEGATIVE
Spec Grav, UA: 1.01
Urobilinogen, UA: 0.2

## 2011-05-11 MED ORDER — DOXYCYCLINE HYCLATE 100 MG PO CAPS: 100.0000 mg | ORAL_CAPSULE | Freq: Two times a day (BID) | ORAL | Status: AC

## 2011-05-11 NOTE — Patient Instructions (Signed)
Start antibiotic if you have any fever or worsening/persistent testicular pain.

## 2011-05-11 NOTE — Progress Notes (Signed)
  Subjective:    Patient ID: Benjamin Owen, male    DOB: [birth year], 35 y.o.   MRN: [id number]  HPI  Acute visit. Patient presents with some mild burning with urination past 2-3 days. Intermittent low back pain. No urethral discharge. No history of STD. Patient is monogamous with no risk factors for STD. No fever or chills. Has noted some mild left testicular pain without swelling. Question history of epididymitis previously.  No skin rashes and no adenopathy.   Review of Systems  Constitutional: Negative for fever and chills.  Gastrointestinal: Negative for abdominal pain.  Genitourinary: Positive for dysuria and testicular pain. Negative for hematuria.       Objective:   Physical Exam  Constitutional: He appears well-developed and well-nourished. No distress.  Cardiovascular: Normal rate and regular rhythm.   No murmur heard. Pulmonary/Chest: Effort normal and breath sounds normal. No respiratory distress. He has no wheezes. He has no rales.  Abdominal: Soft. There is no tenderness.  Genitourinary:       Mild left epididymis pain. Otherwise no testicular pain. No testicular mass. No hernia          Assessment & Plan:  #1 dysuria. No evidence for UTI on dipstick. Reassurance. #2 mild left testicular pain. Suspect acute epididymitis. Start doxycycline 1 hours twice a day for 10 days if pain persist

## 2011-05-24 ENCOUNTER — Other Ambulatory Visit: Payer: Self-pay | Admitting: Family Medicine

## 2011-05-31 ENCOUNTER — Other Ambulatory Visit: Payer: Self-pay | Admitting: *Deleted

## 2011-05-31 MED ORDER — FEXOFENADINE HCL 180 MG PO TABS: 180.0000 mg | ORAL_TABLET | Freq: Every day | ORAL | Status: DC

## 2011-08-05 ENCOUNTER — Other Ambulatory Visit: Payer: Self-pay | Admitting: Gastroenterology

## 2011-08-18 ENCOUNTER — Encounter: Payer: Self-pay | Admitting: Family Medicine

## 2011-08-18 ENCOUNTER — Ambulatory Visit (INDEPENDENT_AMBULATORY_CARE_PROVIDER_SITE_OTHER): Payer: BC Managed Care – PPO | Admitting: Family Medicine

## 2011-08-18 VITALS — BP 124/86 | HR 79 | Temp 98.7°F | Wt 167.0 lb

## 2011-08-18 DIAGNOSIS — N419 Inflammatory disease of prostate, unspecified: Secondary | ICD-10-CM

## 2011-08-18 LAB — POCT URINALYSIS DIPSTICK
Ketones, UA: NEGATIVE
Leukocytes, UA: NEGATIVE
Nitrite, UA: NEGATIVE
Urobilinogen, UA: 0.2

## 2011-08-18 MED ORDER — CIPROFLOXACIN HCL 500 MG PO TABS: 500.0000 mg | ORAL_TABLET | Freq: Two times a day (BID) | ORAL | Status: AC

## 2011-08-18 NOTE — Progress Notes (Signed)
  Subjective:    Patient ID: Benjamin Owen, male    DOB: Jun 09, 1976, 36 y.o.   MRN: [id number]  HPI Here for 2 weeks of generalized fatigue, nausea without vomiting, slight burning on urination, and a dull HA behind the eyes. No fever or sweats, no ST or cough. No diarrhea. He was here on 05-11-11 and was treated for an epididymitis with 10 days of Doxycycline. These symptoms went away.   Review of Systems  Constitutional: Positive for fatigue. Negative for fever.  HENT: Negative.   Eyes: Negative.   Respiratory: Negative.   Cardiovascular: Negative.   Gastrointestinal: Positive for nausea. Negative for vomiting, abdominal pain, diarrhea, constipation, blood in stool, abdominal distention and anal bleeding.  Genitourinary: Positive for dysuria and frequency. Negative for hematuria, flank pain, discharge and difficulty urinating.       Objective:   Physical Exam  Constitutional: He appears well-developed and well-nourished.  Neck: No thyromegaly present.  Cardiovascular: Normal rate, regular rhythm and intact distal pulses.   Pulmonary/Chest: Effort normal and breath sounds normal.  Abdominal: Soft. Bowel sounds are normal. He exhibits no distension and no mass. There is no rebound and no guarding.       Mildly tender above the pubis  Genitourinary:       Testicles are normal. The prostate is mildly enlarged and quite tender  Lymphadenopathy:    He has no cervical adenopathy.          Assessment & Plan:

## 2011-08-18 NOTE — Progress Notes (Signed)
Addended by: Aniceto Boss A on: 08/18/2011 03:31 PM   Modules accepted: Orders

## 2011-08-23 ENCOUNTER — Other Ambulatory Visit: Payer: Self-pay | Admitting: Family Medicine

## 2011-10-29 ENCOUNTER — Other Ambulatory Visit: Payer: Self-pay | Admitting: Family Medicine

## 2011-11-21 ENCOUNTER — Other Ambulatory Visit: Payer: Self-pay | Admitting: Family Medicine

## 2011-11-27 ENCOUNTER — Other Ambulatory Visit: Payer: Self-pay | Admitting: Family Medicine

## 2011-11-28 ENCOUNTER — Other Ambulatory Visit: Payer: Self-pay | Admitting: Family Medicine

## 2012-01-26 ENCOUNTER — Ambulatory Visit (INDEPENDENT_AMBULATORY_CARE_PROVIDER_SITE_OTHER): Payer: BC Managed Care – PPO | Admitting: Family Medicine

## 2012-01-26 ENCOUNTER — Encounter: Payer: Self-pay | Admitting: Family Medicine

## 2012-01-26 VITALS — BP 122/74 | HR 108 | Temp 98.8°F | Wt 170.0 lb

## 2012-01-26 DIAGNOSIS — L0291 Cutaneous abscess, unspecified: Secondary | ICD-10-CM

## 2012-01-26 DIAGNOSIS — L0390 Cellulitis, unspecified: Secondary | ICD-10-CM

## 2012-01-26 MED ORDER — DOXYCYCLINE HYCLATE 100 MG PO CAPS: 100.0000 mg | ORAL_CAPSULE | Freq: Two times a day (BID) | ORAL | Status: DC

## 2012-01-26 NOTE — Progress Notes (Signed)
  Subjective:    Patient ID: Benjamin Owen, male    DOB: December 24, 1975, 36 y.o.   MRN: [id number]  HPI Here for 2 days of the sudden appearance of mildly painful blisters on the right hand, the abdomen, and the left forearm. He feels fine in general.    Review of Systems  Constitutional: Negative.   Eyes: Negative.   Respiratory: Negative.   Cardiovascular: Negative.        Objective:   Physical Exam  Constitutional: He appears well-developed and well-nourished.  Lymphadenopathy:    He has no cervical adenopathy.  Skin:       Scattered vesicles as above.           Assessment & Plan:  It is unclear if this has a viral or a bacterial etiology. A culture was obtained from a vesicle on the right hand that was opened today. Cover with Doxycycline.

## 2012-01-29 ENCOUNTER — Other Ambulatory Visit: Payer: Self-pay | Admitting: Gastroenterology

## 2012-01-29 ENCOUNTER — Telehealth: Payer: Self-pay | Admitting: Family Medicine

## 2012-01-29 LAB — WOUND CULTURE
Gram Stain: NONE SEEN
Gram Stain: NONE SEEN

## 2012-01-29 NOTE — Progress Notes (Signed)
Quick Note:  I spoke with pt ______ 

## 2012-01-29 NOTE — Telephone Encounter (Signed)
Caller: Benjamin Owen/Patient; PCP: Nelwyn Salisbury.; CB#: [phone];  Call regarding Calling for Lab Results from ofc visit with Dr. Clent Ridges 01/26/12.  Pt has some blistered areas and culture was sent off.  Also He is on Doxycycline bid and if he takes it on an empty stomach as directed it is making him nauseated.  Final culture results shows MRSA.  I did not tell pt this , just told him it shows a staph inf.   (PLEASE CALL PT IF THESE ARE ANY INFECTION CONTROL ISSUES HE NEEDS TO BE AWARE OF AS HE HAS SMALL CHILDREN)  and note from Dr. Clent Ridges states to finish up the anbx. DOES PT NEED A F/U APPT?   Inst to call pharmacy to see if there is any other way he can take the Doxycycline.  Call back inst given.

## 2012-01-29 NOTE — Telephone Encounter (Signed)
See the culture report. He has MRSA, and this should respond well to the Doxycycline. Take this with some food, and this will help the upset stomach. See me when he is finished with the meds.

## 2012-01-29 NOTE — Telephone Encounter (Signed)
I spoke with pt  

## 2012-02-12 ENCOUNTER — Encounter: Payer: Self-pay | Admitting: Family Medicine

## 2012-02-12 ENCOUNTER — Ambulatory Visit (INDEPENDENT_AMBULATORY_CARE_PROVIDER_SITE_OTHER): Payer: BC Managed Care – PPO | Admitting: Family Medicine

## 2012-02-12 VITALS — BP 130/80 | HR 84 | Temp 99.2°F | Wt 173.0 lb

## 2012-02-12 DIAGNOSIS — Z22322 Carrier or suspected carrier of Methicillin resistant Staphylococcus aureus: Secondary | ICD-10-CM

## 2012-02-12 MED ORDER — DOXYCYCLINE HYCLATE 100 MG PO CAPS: 100.0000 mg | ORAL_CAPSULE | Freq: Two times a day (BID) | ORAL | Status: AC

## 2012-02-12 NOTE — Progress Notes (Signed)
  Subjective:    Patient ID: Benjamin Owen, male    DOB: Jan 18, 1976, 36 y.o.   MRN: [id number]  HPI Here to follow up on culture proven MRSA. He had several patches on the hands and these cleared up with 10 days of Doxycycline. However over the weekend a new spot came up on the right forearm that he asks to look at. It is not painful.    Review of Systems  Constitutional: Negative.   Skin: Positive for rash.       Objective:   Physical Exam  Constitutional: He appears well-developed and well-nourished.  Skin:       The previous sites on his hands look much better and have only some slight pinkness to them now. He does have a new red pustule on the right forearm.           Assessment & Plan:  This is probably a new spot of MRSA so he will take another 14 days course of Doxycycline. Recheck prn

## 2012-03-28 ENCOUNTER — Encounter: Payer: Self-pay | Admitting: Internal Medicine

## 2012-03-28 ENCOUNTER — Ambulatory Visit (INDEPENDENT_AMBULATORY_CARE_PROVIDER_SITE_OTHER): Payer: BC Managed Care – PPO | Admitting: Internal Medicine

## 2012-03-28 VITALS — BP 130/90 | Temp 98.3°F | Resp 20 | Wt 173.0 lb

## 2012-03-28 DIAGNOSIS — J019 Acute sinusitis, unspecified: Secondary | ICD-10-CM

## 2012-03-28 DIAGNOSIS — R079 Chest pain, unspecified: Secondary | ICD-10-CM

## 2012-03-28 MED ORDER — HYDROCODONE-ACETAMINOPHEN 5-500 MG PO TABS: 1.0000 | ORAL_TABLET | Freq: Three times a day (TID) | ORAL | Status: DC | PRN

## 2012-03-28 NOTE — Progress Notes (Signed)
  Subjective:    Patient ID: Benjamin Owen, male    DOB: 1975/10/16, 36 y.o.   MRN: [id number]  HPI  36 year old patient who presents with 2 concerns. He has right-sided chest pain that began 4 days ago when he traumatized his right lateral chest wall when he fell. Pain was modest early on but today became much more severe. Pain is aggravated by deep inspiration turning twisting etc. Patient also has a history of allergic rhinitis and asthma. He states for the past 3 weeks he has had increasing sinus congestion drainage and has been expectorating yellow sputum tinnitus. Time he has had worsening congestion drainage and now a sense of unwellness. No documented fever.    Review of Systems  Constitutional: Negative for fever, chills, appetite change and fatigue.  HENT: Positive for congestion and sinus pressure. Negative for hearing loss, ear pain, sore throat, trouble swallowing, neck stiffness, dental problem, voice change and tinnitus.   Eyes: Negative for pain, discharge and visual disturbance.  Respiratory: Negative for cough, chest tightness, wheezing and stridor.   Cardiovascular: Positive for chest pain. Negative for palpitations and leg swelling.  Gastrointestinal: Negative for nausea, vomiting, abdominal pain, diarrhea, constipation, blood in stool and abdominal distention.  Genitourinary: Negative for urgency, hematuria, flank pain, discharge, difficulty urinating and genital sores.  Musculoskeletal: Negative for myalgias, back pain, joint swelling, arthralgias and gait problem.  Skin: Negative for rash.  Neurological: Negative for dizziness, syncope, speech difficulty, weakness, numbness and headaches.  Hematological: Negative for adenopathy. Does not bruise/bleed easily.  Psychiatric/Behavioral: Negative for behavioral problems and dysphoric mood. The patient is not nervous/anxious.        Objective:   Physical Exam  Constitutional: He is oriented to person, place, and time. He  appears well-developed.  HENT:  Head: Normocephalic.  Right Ear: External ear normal.  Left Ear: External ear normal.  Eyes: Conjunctivae normal and EOM are normal.  Neck: Normal range of motion.  Cardiovascular: Normal rate and normal heart sounds.   Pulmonary/Chest: Breath sounds normal. No respiratory distress. He has no wheezes. He has no rales.       Tenderness right anterolateral chest wall  Breath sounds are equal bilaterally  Abdominal: Bowel sounds are normal.  Musculoskeletal: Normal range of motion. He exhibits no edema and no tenderness.  Neurological: He is alert and oriented to person, place, and time.  Psychiatric: He has a normal mood and affect. His behavior is normal.          Assessment & Plan:   Chest wall pain. Contusion versus rib fracture. We'll treat symptomatically Low-grade sinusitis. Patient has had worsening symptoms for 3 weeks we'll treat with Avelox for 6 days. Samples provided  Followup PCP when necessary

## 2012-03-28 NOTE — Patient Instructions (Signed)
    Use saline irrigation, warm  moist compresses and over-the-counter decongestants only as directed.  Call if there is no improvement in 5 to 7 days, or sooner if you develop increasing pain, fever, or any new symptoms.  Take your antibiotic as prescribed until ALL of it is gone, but stop if you develop a rash, swelling, or any side effects of the medication.  Contact our office as soon as possible if  there are side effects of the medication. 

## 2012-03-31 ENCOUNTER — Other Ambulatory Visit: Payer: Self-pay | Admitting: Gastroenterology

## 2012-05-06 ENCOUNTER — Encounter: Payer: Self-pay | Admitting: Family Medicine

## 2012-05-06 ENCOUNTER — Ambulatory Visit (INDEPENDENT_AMBULATORY_CARE_PROVIDER_SITE_OTHER): Payer: BC Managed Care – PPO | Admitting: Family Medicine

## 2012-05-06 VITALS — BP 130/80 | HR 125 | Temp 99.3°F | Wt 173.0 lb

## 2012-05-06 DIAGNOSIS — J40 Bronchitis, not specified as acute or chronic: Secondary | ICD-10-CM

## 2012-05-06 MED ORDER — AZITHROMYCIN 250 MG PO TABS: ORAL_TABLET | ORAL | Status: DC

## 2012-05-06 NOTE — Progress Notes (Signed)
  Subjective:    Patient ID: Benjamin Owen, male    DOB: 11/01/1975, 36 y.o.   MRN: [id number]  HPI Here for one week of chest congestion and a dry cough. His chest burns. He has had a fever.    Review of Systems  Constitutional: Positive for fever.  HENT: Negative.   Eyes: Negative.   Respiratory: Positive for cough and chest tightness.        Objective:   Physical Exam  Constitutional: He appears well-developed and well-nourished.  HENT:  Right Ear: External ear normal.  Left Ear: External ear normal.  Nose: Nose normal.  Mouth/Throat: Oropharynx is clear and moist.  Eyes: Conjunctivae normal are normal.  Pulmonary/Chest: Effort normal. No respiratory distress. He has no wheezes. He has no rales.       Scattered rhonchi   Lymphadenopathy:    He has no cervical adenopathy.          Assessment & Plan:  Drink fluids and add Delsym.

## 2012-05-28 ENCOUNTER — Other Ambulatory Visit: Payer: Self-pay | Admitting: Gastroenterology

## 2012-05-30 ENCOUNTER — Other Ambulatory Visit: Payer: Self-pay | Admitting: Gastroenterology

## 2012-05-31 ENCOUNTER — Encounter: Payer: Self-pay | Admitting: *Deleted

## 2012-06-04 ENCOUNTER — Ambulatory Visit (INDEPENDENT_AMBULATORY_CARE_PROVIDER_SITE_OTHER): Payer: BC Managed Care – PPO | Admitting: Gastroenterology

## 2012-06-04 ENCOUNTER — Encounter: Payer: Self-pay | Admitting: Gastroenterology

## 2012-06-04 VITALS — BP 112/80 | HR 70 | Ht 68.0 in | Wt 174.1 lb

## 2012-06-04 DIAGNOSIS — K219 Gastro-esophageal reflux disease without esophagitis: Secondary | ICD-10-CM

## 2012-06-04 MED ORDER — DEXLANSOPRAZOLE 60 MG PO CPDR: 60.0000 mg | DELAYED_RELEASE_CAPSULE | Freq: Every day | ORAL | Status: DC

## 2012-06-04 NOTE — Patient Instructions (Addendum)
We have sent the following medications to your pharmacy for you to pick up at your convenience: Dexilant. Please take one capsule by mouth once daily.   Please follow up in one year with Dr. Jarold Motto.  _______________________________________________________________________________________________________________  Gastroesophageal Reflux Disease, Adult Gastroesophageal reflux disease (GERD) happens when acid from your stomach flows up into the esophagus. When acid comes in contact with the esophagus, the acid causes soreness (inflammation) in the esophagus. Over time, GERD may create small holes (ulcers) in the lining of the esophagus. CAUSES   Increased body weight. This puts pressure on the stomach, making acid rise from the stomach into the esophagus.  Smoking. This increases acid production in the stomach.  Drinking alcohol. This causes decreased pressure in the lower esophageal sphincter (valve or ring of muscle between the esophagus and stomach), allowing acid from the stomach into the esophagus.  Late evening meals and a full stomach. This increases pressure and acid production in the stomach.  A malformed lower esophageal sphincter. Sometimes, no cause is found. SYMPTOMS   Burning pain in the lower part of the mid-chest behind the breastbone and in the mid-stomach area. This may occur twice a week or more often.  Trouble swallowing.  Sore throat.  Dry cough.  Asthma-like symptoms including chest tightness, shortness of breath, or wheezing. DIAGNOSIS  Your caregiver may be able to diagnose GERD based on your symptoms. In some cases, X-rays and other tests may be done to check for complications or to check the condition of your stomach and esophagus. TREATMENT  Your caregiver may recommend over-the-counter or prescription medicines to help decrease acid production. Ask your caregiver before starting or adding any new medicines.  HOME CARE INSTRUCTIONS   Change the factors  that you can control. Ask your caregiver for guidance concerning weight loss, quitting smoking, and alcohol consumption.  Avoid foods and drinks that make your symptoms worse, such as:  Caffeine or alcoholic drinks.  Chocolate.  Peppermint or mint flavorings.  Garlic and onions.  Spicy foods.  Citrus fruits, such as oranges, lemons, or limes.  Tomato-based foods such as sauce, chili, salsa, and pizza.  Fried and fatty foods.  Avoid lying down for the 3 hours prior to your bedtime or prior to taking a nap.  Eat small, frequent meals instead of large meals.  Wear loose-fitting clothing. Do not wear anything tight around your waist that causes pressure on your stomach.  Raise the head of your bed 6 to 8 inches with wood blocks to help you sleep. Extra pillows will not help.  Only take over-the-counter or prescription medicines for pain, discomfort, or fever as directed by your caregiver.  Do not take aspirin, ibuprofen, or other nonsteroidal anti-inflammatory drugs (NSAIDs). SEEK IMMEDIATE MEDICAL CARE IF:   You have pain in your arms, neck, jaw, teeth, or back.  Your pain increases or changes in intensity or duration.  You develop nausea, vomiting, or sweating (diaphoresis).  You develop shortness of breath, or you faint.  Your vomit is green, yellow, black, or looks like coffee grounds or blood.  Your stool is red, bloody, or black. These symptoms could be signs of other problems, such as heart disease, gastric bleeding, or esophageal bleeding. MAKE SURE YOU:   Understand these instructions.  Will watch your condition.  Will get help right away if you are not doing well or get worse. Document Released: 04/05/2005 Document Revised: 09/18/2011 Document Reviewed: 01/13/2011 [facility] Patient Information 2013 [place], [place].

## 2012-06-04 NOTE — Progress Notes (Signed)
This is a very nice 36 year old Caucasian male who I previously have treated for rectal fissures.  Currently is asymptomatic in terms of any lower GI issues.  He has chronic acid reflux disease in his PPI-dependent, currently taking Dexilant 60 mg a day. He apparently has been told by his pharmacist that this is the only PPI he can take with Lexapro.  In any case, he currently is asymptomatic and denies chest pain, coughing, hoarseness, dysphagia, or any hepatobiliary complaints.  His appetite is good and his weight is stable.  Endoscopy in July of 2012 was fairly unremarkable including esophageal biopsies.  Current Medications, Allergies, Past Medical History, Past Surgical History, Family History and Social History were reviewed in [employer] record.  Pertinent Review of Systems Negative   Physical Exam: Severe patient in no distress.  Blood pressure 112/80, pulse 70 and regular, weight 174 pounds with a BMI of 26.48.  I cannot appreciate stigmata of chronic liver disease.  General physical exam was not performed.  Abdominal exam is unremarkable.    Assessment and Plan: Well controlled acid reflux on daily PPI therapy.  Review of his labs from last year showed no abnormalities.  We reviewed use of PPI medications long-term, reviewed a reflux regime, and we'll continue to see him on a yearly basis or when necessary as needed.  With his next office visit he should probably have serum B12, magnesium level, CBC and metabolic profiles done. No diagnosis found.

## 2012-10-04 ENCOUNTER — Encounter: Payer: Self-pay | Admitting: Family Medicine

## 2012-10-04 ENCOUNTER — Ambulatory Visit (INDEPENDENT_AMBULATORY_CARE_PROVIDER_SITE_OTHER): Payer: BC Managed Care – PPO | Admitting: Family Medicine

## 2012-10-04 VITALS — BP 120/80 | HR 79 | Temp 98.7°F | Wt 166.0 lb

## 2012-10-04 DIAGNOSIS — N410 Acute prostatitis: Secondary | ICD-10-CM

## 2012-10-04 MED ORDER — CIPROFLOXACIN HCL 500 MG PO TABS: 500.0000 mg | ORAL_TABLET | Freq: Two times a day (BID) | ORAL | Status: DC

## 2012-10-04 NOTE — Progress Notes (Signed)
  Subjective:    Patient ID: Benjamin Owen, male    DOB: Feb 02, 1976, 37 y.o.   MRN: [id number]  HPI Here for 2 days of feeling fatigued, achy, and having urgency to urinate. No burning or fever. Drinking fluids.he feels like he did a year ago when he had a prostate infection.   Review of Systems  Constitutional: Positive for fatigue. Negative for fever.  HENT: Negative.   Eyes: Negative.   Respiratory: Negative.   Cardiovascular: Negative.   Gastrointestinal: Negative.   Genitourinary: Positive for urgency and frequency. Negative for dysuria, hematuria and testicular pain.       Objective:   Physical Exam  Constitutional: He appears well-developed and well-nourished. No distress.  Cardiovascular: Normal rate, regular rhythm, normal heart sounds and intact distal pulses.   Pulmonary/Chest: Effort normal and breath sounds normal.  Abdominal: Soft. Bowel sounds are normal. He exhibits no distension and no mass. There is no tenderness. There is no rebound and no guarding.          Assessment & Plan:  Recheck prn

## 2012-10-07 ENCOUNTER — Telehealth: Payer: Self-pay | Admitting: Family Medicine

## 2012-10-07 MED ORDER — MONTELUKAST SODIUM 10 MG PO TABS: 10.0000 mg | ORAL_TABLET | Freq: Every day | ORAL | Status: DC

## 2012-10-07 NOTE — Telephone Encounter (Signed)
Refill request for Singulair 10 mg and I did send e-scribe.

## 2012-11-20 ENCOUNTER — Telehealth: Payer: Self-pay | Admitting: Family Medicine

## 2012-11-20 NOTE — Telephone Encounter (Signed)
Okay for one year  

## 2012-11-20 NOTE — Telephone Encounter (Signed)
Refill request for Lexapro 10 mg take 1 po qd

## 2012-11-21 ENCOUNTER — Other Ambulatory Visit: Payer: Self-pay | Admitting: Family Medicine

## 2012-11-22 MED ORDER — ESCITALOPRAM OXALATE 10 MG PO TABS: 10.0000 mg | ORAL_TABLET | Freq: Every day | ORAL | Status: DC

## 2012-11-22 NOTE — Telephone Encounter (Signed)
I sent script e-scribe. 

## 2012-12-21 ENCOUNTER — Other Ambulatory Visit: Payer: Self-pay | Admitting: Family Medicine

## 2013-02-20 ENCOUNTER — Other Ambulatory Visit: Payer: Self-pay | Admitting: Family Medicine

## 2013-03-11 ENCOUNTER — Other Ambulatory Visit: Payer: Self-pay | Admitting: Family Medicine

## 2013-03-21 ENCOUNTER — Other Ambulatory Visit: Payer: Self-pay | Admitting: Gastroenterology

## 2013-05-08 ENCOUNTER — Other Ambulatory Visit: Payer: Self-pay | Admitting: Family Medicine

## 2013-05-21 ENCOUNTER — Ambulatory Visit (INDEPENDENT_AMBULATORY_CARE_PROVIDER_SITE_OTHER): Payer: BC Managed Care – PPO | Admitting: Family Medicine

## 2013-05-21 ENCOUNTER — Encounter: Payer: Self-pay | Admitting: Family Medicine

## 2013-05-21 VITALS — BP 126/70 | HR 75 | Temp 98.5°F | Wt 173.0 lb

## 2013-05-21 DIAGNOSIS — H5710 Ocular pain, unspecified eye: Secondary | ICD-10-CM

## 2013-05-21 DIAGNOSIS — K219 Gastro-esophageal reflux disease without esophagitis: Secondary | ICD-10-CM

## 2013-05-21 DIAGNOSIS — H5711 Ocular pain, right eye: Secondary | ICD-10-CM

## 2013-05-21 MED ORDER — PANTOPRAZOLE SODIUM 40 MG PO TBEC: 40.0000 mg | DELAYED_RELEASE_TABLET | Freq: Every day | ORAL | Status: DC

## 2013-05-21 NOTE — Progress Notes (Signed)
  Subjective:    Patient ID: Benjamin Owen, male    DOB: [birth year], 37 y.o.   MRN: [id number]  HPI Here for 2 things. First his Dexilant is not covered by insurance so he asks to switch to a generic. Also for 3 days he has had a mild pain around the right eye with a mild frontal HA and some twitching of the eye. No eye redness or DC. No light sensitivity. He does not think this is a sinus infection, nor does he think this is a migraine phenomenon. No blurred vision.    Review of Systems  Constitutional: Negative.   HENT: Negative for congestion, ear discharge, ear pain, facial swelling, rhinorrhea and sinus pressure.   Eyes: Positive for pain. Negative for photophobia, discharge, redness, itching and visual disturbance.  Respiratory: Negative.        Objective:   Physical Exam  Constitutional: He is oriented to person, place, and time. He appears well-developed and well-nourished. No distress.  HENT:  Head: Normocephalic and atraumatic.  Right Ear: External ear normal.  Left Ear: External ear normal.  Nose: Nose normal.  Mouth/Throat: Oropharynx is clear and moist.  Eyes: Conjunctivae and EOM are normal. Pupils are equal, round, and reactive to light. Right eye exhibits no discharge. Left eye exhibits no discharge. No scleral icterus.  Neck: Neck supple.  Lymphadenopathy:    He has no cervical adenopathy.  Neurological: He is alert and oriented to person, place, and time.          Assessment & Plan:  Switch to Pantoprozol for the GERD. It is not clear what may be going on in his eye. He does not appear to have a pinkeye. This is not likely to be either glaucoma or iritis, but these are possibilities. I suggested he see his eye doctor in the next 2-3 days for this. He will recheck with [place] prn

## 2013-05-21 NOTE — Progress Notes (Signed)
Pre visit review using our clinic review tool, if applicable. No additional management support is needed unless otherwise documented below in the visit note. 

## 2013-05-22 ENCOUNTER — Other Ambulatory Visit: Payer: Self-pay | Admitting: Family Medicine

## 2013-08-18 ENCOUNTER — Telehealth: Payer: Self-pay | Admitting: Family Medicine

## 2013-08-18 NOTE — Telephone Encounter (Signed)
[facility] requesting new script for SINGULAIR 10 MG tablet

## 2013-08-19 MED ORDER — MONTELUKAST SODIUM 10 MG PO TABS: ORAL_TABLET | ORAL | Status: DC

## 2013-08-19 NOTE — Telephone Encounter (Signed)
I did sent script e-scribe to [facility] and a 90 day supply.

## 2013-08-22 ENCOUNTER — Other Ambulatory Visit: Payer: Self-pay | Admitting: Family Medicine

## 2013-12-29 ENCOUNTER — Other Ambulatory Visit: Payer: Self-pay | Admitting: Family Medicine

## 2014-02-05 ENCOUNTER — Ambulatory Visit (INDEPENDENT_AMBULATORY_CARE_PROVIDER_SITE_OTHER): Payer: BC Managed Care – PPO | Admitting: Family Medicine

## 2014-02-05 ENCOUNTER — Encounter: Payer: Self-pay | Admitting: Family Medicine

## 2014-02-05 VITALS — BP 118/68 | HR 71 | Temp 98.9°F | Ht 68.0 in | Wt 164.0 lb

## 2014-02-05 DIAGNOSIS — B029 Zoster without complications: Secondary | ICD-10-CM

## 2014-02-05 MED ORDER — VALACYCLOVIR HCL 1 G PO TABS: 1000.0000 mg | ORAL_TABLET | Freq: Three times a day (TID) | ORAL | Status: DC

## 2014-02-05 NOTE — Progress Notes (Signed)
Pre visit review using our clinic review tool, if applicable. No additional management support is needed unless otherwise documented below in the visit note. 

## 2014-02-05 NOTE — Progress Notes (Signed)
   Subjective:    Patient ID: Benjamin Owen, male    DOB: 11/28/1975, 38 y.o.   MRN: [id number]  HPI Here for 4 days of a rash in the center of his back that itches and burns. A week prior to that he was stung multiple times by bees and he went to [facility]. He said he received 3 shots of something, and he felt bad for several days after that. He is not sure what was given to him, but one of these was likely a steroid.    Review of Systems  Constitutional: Negative.   Skin: Positive for rash.       Objective:   Physical Exam  Constitutional: He appears well-developed and well-nourished.  Cardiovascular: Normal rate, regular rhythm, normal heart sounds and intact distal pulses.   Pulmonary/Chest: Effort normal and breath sounds normal.  Skin:  Several large patches of red papules and vesicles in the center of the back, and he has several smaller areas like this around the left side and the left chest area           Assessment & Plan:  This is shingles. Treat with Valtrex. Recheck prn

## 2014-03-17 ENCOUNTER — Other Ambulatory Visit: Payer: Self-pay | Admitting: Family Medicine

## 2014-03-24 ENCOUNTER — Other Ambulatory Visit: Payer: Self-pay | Admitting: Family Medicine

## 2014-03-27 ENCOUNTER — Telehealth: Payer: Self-pay | Admitting: Family Medicine

## 2014-03-27 MED ORDER — CIPROFLOXACIN HCL 500 MG PO TABS: 500.0000 mg | ORAL_TABLET | Freq: Two times a day (BID) | ORAL | Status: DC

## 2014-03-27 NOTE — Telephone Encounter (Signed)
Pt 's spouse will pick up.

## 2014-03-27 NOTE — Telephone Encounter (Signed)
Pt requesting a printed script for Cipro, he has a planned trip to [place] and would like this as a preventative measure. Pt's wife will be here on 03/30/14 and would like to pick up at this time.

## 2014-03-27 NOTE — Telephone Encounter (Signed)
done

## 2014-04-27 ENCOUNTER — Other Ambulatory Visit: Payer: Self-pay | Admitting: Family Medicine

## 2014-05-27 ENCOUNTER — Other Ambulatory Visit: Payer: Self-pay | Admitting: Family Medicine

## 2014-06-29 ENCOUNTER — Ambulatory Visit (INDEPENDENT_AMBULATORY_CARE_PROVIDER_SITE_OTHER): Payer: BC Managed Care – PPO | Admitting: Family Medicine

## 2014-06-29 ENCOUNTER — Encounter: Payer: Self-pay | Admitting: Family Medicine

## 2014-06-29 VITALS — BP 114/73 | HR 81 | Temp 98.8°F | Ht 68.0 in | Wt 173.0 lb

## 2014-06-29 DIAGNOSIS — N410 Acute prostatitis: Secondary | ICD-10-CM

## 2014-06-29 LAB — POCT URINALYSIS DIPSTICK
Bilirubin, UA: NEGATIVE
Glucose, UA: NEGATIVE
Ketones, UA: NEGATIVE
NITRITE UA: NEGATIVE
PH UA: 7.5
Protein, UA: NEGATIVE
RBC UA: NEGATIVE
Spec Grav, UA: 1.01
Urobilinogen, UA: 0.2

## 2014-06-29 MED ORDER — CIPROFLOXACIN HCL 500 MG PO TABS: 500.0000 mg | ORAL_TABLET | Freq: Two times a day (BID) | ORAL | Status: DC

## 2014-06-29 NOTE — Progress Notes (Signed)
   Subjective:    Patient ID: Benjamin Owen, male    DOB: 07/21/1975, 38 y.o.   MRN: [id number]  HPI Here for several days of fatigue, testicular discomfort and perineal discomfort. No urethral DC or burning. No fever. He had episodes of prostatitis in 2013 and again last year. This feels like the same thing to him.    Review of Systems  Constitutional: Positive for fatigue. Negative for fever, chills and diaphoresis.  Gastrointestinal: Negative.   Genitourinary: Positive for testicular pain. Negative for dysuria, hematuria and discharge.       Objective:   Physical Exam  Constitutional: He appears well-developed and well-nourished.  Abdominal: Soft. Bowel sounds are normal. He exhibits no distension and no mass. There is no tenderness. There is no guarding.  Genitourinary:  Testicles are not tender. The prostate is mildly swollen, boggy, and mildly tender          Assessment & Plan:  Treat with Cipro for 14 days

## 2014-06-29 NOTE — Progress Notes (Signed)
Pre visit review using our clinic review tool, if applicable. No additional management support is needed unless otherwise documented below in the visit note. 

## 2014-06-29 NOTE — Addendum Note (Signed)
Addended by: Aniceto BossNIMMONS, SYLVIA A on: 06/29/2014 05:11 PM   Modules accepted: Orders

## 2014-06-30 ENCOUNTER — Telehealth: Payer: Self-pay | Admitting: Family Medicine

## 2014-06-30 ENCOUNTER — Ambulatory Visit: Payer: BC Managed Care – PPO | Admitting: Family Medicine

## 2014-06-30 NOTE — Telephone Encounter (Signed)
Call in Doxycycline 100 mg bid for 14 days 

## 2014-06-30 NOTE — Telephone Encounter (Signed)
Patient states ciprofloxacin (CIPRO) 500 MG tablet is causing nausea and would like a different anti-biotic called in to [id number] - [place], [place] - [phone]

## 2014-07-01 MED ORDER — DOXYCYCLINE HYCLATE 100 MG PO TABS: 100.0000 mg | ORAL_TABLET | Freq: Two times a day (BID) | ORAL | Status: DC

## 2014-07-01 NOTE — Telephone Encounter (Signed)
I sent script e-scribe and spoke with pt. 

## 2014-11-12 ENCOUNTER — Encounter: Payer: Self-pay | Admitting: Gastroenterology

## 2014-11-25 ENCOUNTER — Encounter: Payer: Self-pay | Admitting: Family Medicine

## 2014-11-25 ENCOUNTER — Ambulatory Visit (INDEPENDENT_AMBULATORY_CARE_PROVIDER_SITE_OTHER): Payer: BLUE CROSS/BLUE SHIELD | Admitting: Family Medicine

## 2014-11-25 VITALS — BP 111/73 | HR 75 | Temp 98.5°F | Ht 68.0 in | Wt 169.0 lb

## 2014-11-25 DIAGNOSIS — E785 Hyperlipidemia, unspecified: Secondary | ICD-10-CM | POA: Diagnosis not present

## 2014-11-25 NOTE — Progress Notes (Signed)
   Subjective:    Patient ID: Lovie CholMatthew A Errico, male    DOB: 06/01/1976, 39 y.o.   MRN: [id number]  HPI Here to discuss labs from a life insurance screening he did recently. His total chol was 236, HDL was 30, TG were 733, and the LDL was not calculable. His A1c and fructosamine were normal. He notes that his family has a strong family hx of high lipids. He feels well.   Review of Systems  Constitutional: Negative.   Respiratory: Negative.   Cardiovascular: Negative.        Objective:   Physical Exam  Constitutional: He appears well-developed and well-nourished. No distress.  Neck: No thyromegaly present.  Cardiovascular: Normal rate, regular rhythm, normal heart sounds and intact distal pulses.   Pulmonary/Chest: Effort normal and breath sounds normal.  Lymphadenopathy:    He has no cervical adenopathy.          Assessment & Plan:  Abnormal lipids. We will set him up for repeat fasting labs soon and go from there.

## 2014-11-25 NOTE — Progress Notes (Signed)
Pre visit review using our clinic review tool, if applicable. No additional management support is needed unless otherwise documented below in the visit note. 

## 2014-11-26 LAB — LIPID PANEL
Cholesterol: 228 mg/dL — ABNORMAL HIGH (ref 0–200)
HDL: 38.1 mg/dL — AB (ref >39.00)
LDL Cholesterol: 150 mg/dL — ABNORMAL HIGH (ref 0–99)
NonHDL: 189.9
TRIGLYCERIDES: 199 mg/dL — AB (ref 0.0–149.0)
Total CHOL/HDL Ratio: 6
VLDL: 39.8 mg/dL (ref 0.0–40.0)

## 2014-11-26 LAB — CBC WITH DIFFERENTIAL/PLATELET
Basophils Absolute: 0 10*3/uL (ref 0.0–0.1)
Basophils Relative: 0.3 % (ref 0.0–3.0)
EOS ABS: 0.1 10*3/uL (ref 0.0–0.7)
Eosinophils Relative: 2.6 % (ref 0.0–5.0)
HCT: 44.5 % (ref 39.0–52.0)
HEMOGLOBIN: 15.2 g/dL (ref 13.0–17.0)
LYMPHS PCT: 30.8 % (ref 12.0–46.0)
Lymphs Abs: 1.4 10*3/uL (ref 0.7–4.0)
MCHC: 34.2 g/dL (ref 30.0–36.0)
MCV: 86.8 fl (ref 78.0–100.0)
MONOS PCT: 9.4 % (ref 3.0–12.0)
Monocytes Absolute: 0.4 10*3/uL (ref 0.1–1.0)
NEUTROS ABS: 2.7 10*3/uL (ref 1.4–7.7)
NEUTROS PCT: 56.9 % (ref 43.0–77.0)
Platelets: 216 10*3/uL (ref 150.0–400.0)
RBC: 5.13 Mil/uL (ref 4.22–5.81)
RDW: 13.6 % (ref 11.5–15.5)
WBC: 4.7 10*3/uL (ref 4.0–10.5)

## 2014-11-26 LAB — T4, FREE: Free T4: 0.4 ng/dL — ABNORMAL LOW (ref 0.60–1.60)

## 2014-11-26 LAB — BASIC METABOLIC PANEL
BUN: 14 mg/dL (ref 6–23)
CALCIUM: 9.7 mg/dL (ref 8.4–10.5)
CO2: 30 mEq/L (ref 19–32)
Chloride: 101 mEq/L (ref 96–112)
Creatinine, Ser: 1.04 mg/dL (ref 0.40–1.50)
GFR: 85.09 mL/min (ref >60.00)
Glucose, Bld: 91 mg/dL (ref 70–99)
Potassium: 4.5 mEq/L (ref 3.5–5.1)
SODIUM: 136 meq/L (ref 135–145)

## 2014-11-26 LAB — HEPATIC FUNCTION PANEL
ALT: 20 U/L (ref 0–53)
AST: 22 U/L (ref 0–37)
Albumin: 4.5 g/dL (ref 3.5–5.2)
Alkaline Phosphatase: 55 U/L (ref 39–117)
BILIRUBIN DIRECT: 0.1 mg/dL (ref 0.0–0.3)
BILIRUBIN TOTAL: 0.6 mg/dL (ref 0.2–1.2)
Total Protein: 7.1 g/dL (ref 6.0–8.3)

## 2014-11-26 LAB — TSH: TSH: 17.47 u[IU]/mL — ABNORMAL HIGH (ref 0.35–4.50)

## 2014-11-26 LAB — T3, FREE: T3, Free: 2.8 pg/mL (ref 2.3–4.2)

## 2014-11-26 NOTE — Addendum Note (Signed)
Addended by: Rita OharaHRASHER, Lunah Losasso R on: 11/26/2014 08:29 AM   Modules accepted: Orders

## 2014-11-30 ENCOUNTER — Telehealth: Payer: Self-pay | Admitting: Family Medicine

## 2014-11-30 MED ORDER — LEVOTHYROXINE SODIUM 75 MCG PO TABS: 75.0000 ug | ORAL_TABLET | Freq: Every day | ORAL | Status: DC

## 2014-11-30 NOTE — Telephone Encounter (Signed)
I spoke with pt and went over results. 

## 2014-11-30 NOTE — Telephone Encounter (Signed)
Pt needs blood work results °

## 2014-11-30 NOTE — Addendum Note (Signed)
Addended by: Aniceto BossNIMMONS, SYLVIA A on: 11/30/2014 05:10 PM   Modules accepted: Orders

## 2014-12-29 ENCOUNTER — Other Ambulatory Visit: Payer: Self-pay | Admitting: Family Medicine

## 2015-01-18 ENCOUNTER — Other Ambulatory Visit: Payer: Self-pay | Admitting: Family Medicine

## 2015-01-18 MED ORDER — PANTOPRAZOLE SODIUM 40 MG PO TBEC: 40.0000 mg | DELAYED_RELEASE_TABLET | Freq: Every day | ORAL | Status: DC

## 2015-01-18 NOTE — Telephone Encounter (Signed)
Refill request for Pantoprazole 40 mg take 1 po qd [facility]

## 2015-01-18 NOTE — Telephone Encounter (Signed)
I sent script e-scribe. 

## 2015-02-16 ENCOUNTER — Ambulatory Visit (INDEPENDENT_AMBULATORY_CARE_PROVIDER_SITE_OTHER): Payer: BLUE CROSS/BLUE SHIELD | Admitting: Internal Medicine

## 2015-02-16 ENCOUNTER — Encounter: Payer: Self-pay | Admitting: Internal Medicine

## 2015-02-16 VITALS — BP 118/68 | HR 74 | Temp 98.6°F | Ht 68.0 in | Wt 169.0 lb

## 2015-02-16 DIAGNOSIS — R091 Pleurisy: Secondary | ICD-10-CM | POA: Diagnosis not present

## 2015-02-16 DIAGNOSIS — E039 Hypothyroidism, unspecified: Secondary | ICD-10-CM

## 2015-02-16 DIAGNOSIS — J069 Acute upper respiratory infection, unspecified: Secondary | ICD-10-CM

## 2015-02-16 LAB — TSH: TSH: 1.28 u[IU]/mL (ref 0.35–4.50)

## 2015-02-16 NOTE — Progress Notes (Signed)
Pre visit review using our clinic review tool, if applicable. No additional management support is needed unless otherwise documented below in the visit note. 

## 2015-02-16 NOTE — Progress Notes (Signed)
Subjective:    Patient ID: Benjamin Owen, male    DOB: 1976-04-24, 39 y.o.   MRN: [id number]  HPI 39 year old patient who has a prior history of a suppressed TSH.  Approximately 8 weeks ago he was placed on supplemental levothyroxine for hypothyroidism.  For the past 10 days he has had a general sense of unwellness with some mild left-sided headache.  Yesterday he developed some mild left-sided chest pain with deep inspiration.  No cough or shortness of breath.  Denies any fever.  He states he does have a history of migraine headaches, but these headaches are fairly mild.  Past Medical History  Diagnosis Date  . Allergy   . GERD (gastroesophageal reflux disease)   . Carpal tunnel syndrome   . 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 Chronic headaches   . Anxiety   . Fatigue   . Anal fissure     History   Social History  . Marital Status: Married    Spouse Name: N/A  . Number of Children: 2  . Years of Education: N/A   Occupational History  . financial planner    Social History Main Topics  . Smoking status: Never Smoker   . Smokeless tobacco: Never Used  . Alcohol Use: 0.0 oz/week    0 Standard drinks or equivalent per week     Comment: maybe once a month  . Drug Use: No  . Sexual Activity: Not on file   Other Topics Concern  . Not on file   Social History Narrative    Past Surgical History  Procedure Laterality Date  . Appendectomy  07/10/2005  . Esophagogastroduodenoscopy  01-15-08    per Dr. Jarold Motto, GERD only   . Tonsillectomy    . Lasik      Family History  Problem Relation Age of Onset  . Hyperlipidemia    . Hypertension    . Heart disease Father   . Liver disease Father     cirrhosis  . Colon cancer Neg Hx     Allergies  Allergen Reactions  . Ciprofloxacin     nausea  . Ultram [Tramadol Hcl] Nausea Only    Current Outpatient Prescriptions on File Prior to Visit    Medication Sig Dispense Refill  . [facility] ALLERGY RELIEF 180 MG tablet TAKE 1 TABLET EVERY DAY 30 tablet 6  . escitalopram (LEXAPRO) 10 MG tablet Take 1 tablet (10 mg total) by mouth daily. 30 tablet 11  . escitalopram (LEXAPRO) 10 MG tablet TAKE 1 TABLET EVERY DAY (Patient not taking: Reported on 06/29/2014) 30 tablet 11  . escitalopram (LEXAPRO) 10 MG tablet TAKE 1 TABLET EVERY DAY (Patient not taking: Reported on 06/29/2014) 30 tablet 11  . levothyroxine (SYNTHROID, LEVOTHROID) 75 MCG tablet Take 1 tablet (75 mcg total) by mouth daily. 90 tablet 3  . montelukast (SINGULAIR) 10 MG tablet TAKE 1 TABLET AT BEDTIME 90 tablet 1  . Multiple Vitamin (MULTIVITAMIN) capsule Take 1 capsule by mouth daily.      . pantoprazole (PROTONIX) 40 MG tablet Take 1 tablet (40 mg total) by mouth daily. 90 tablet 1   No current facility-administered medications on file prior to visit.    BP 118/68 mmHg  Pulse 74  Temp(Src) 98.6 F (37 C) (Oral)  Ht 5\' 8"  (1.727 m)  Wt 169 lb (76.658 kg)  BMI 25.70 kg/m2  SpO2 98%      Review  of Systems  Constitutional: Positive for activity change, appetite change and fatigue. Negative for fever and chills.  HENT: Negative for congestion, dental problem, ear pain, hearing loss, sore throat, tinnitus, trouble swallowing and voice change.   Eyes: Negative for pain, discharge and visual disturbance.  Respiratory: Negative for cough, chest tightness, wheezing and stridor.   Cardiovascular: Positive for chest pain. Negative for palpitations and leg swelling.  Gastrointestinal: Negative for nausea, vomiting, abdominal pain, diarrhea, constipation, blood in stool and abdominal distention.  Genitourinary: Negative for urgency, hematuria, flank pain, discharge, difficulty urinating and genital sores.  Musculoskeletal: Negative for myalgias, back pain, joint swelling, arthralgias, gait problem and neck stiffness.  Skin: Negative for rash.  Neurological: Positive for headaches.  Negative for dizziness, syncope, speech difficulty, weakness and numbness.  Hematological: Negative for adenopathy. Does not bruise/bleed easily.  Psychiatric/Behavioral: Negative for behavioral problems and dysphoric mood. The patient is not nervous/anxious.        Objective:   Physical Exam  Constitutional: He is oriented to person, place, and time. He appears well-developed and well-nourished. No distress.  HENT:  Head: Normocephalic.  Right Ear: External ear normal.  Left Ear: External ear normal.  Eyes: Conjunctivae and EOM are normal.  Neck: Normal range of motion.  Cardiovascular: Normal rate and normal heart sounds.   Pulmonary/Chest: Effort normal and breath sounds normal. No respiratory distress. He has no wheezes. He has no rales. He exhibits no tenderness.  No rub  Abdominal: Bowel sounds are normal.  Musculoskeletal: Normal range of motion. He exhibits no edema or tenderness.  Neurological: He is alert and oriented to person, place, and time.  Psychiatric: He has a normal mood and affect. His behavior is normal.          Assessment & Plan:   Probable viral syndrome with mild pleurisy.  Will continue symptomatic treatment Hypothyroidism.  We'll check a TSH.  Adjust levothyroxine if necessary  Follow-up PCP.  3 months

## 2015-02-16 NOTE — Patient Instructions (Signed)
Call or return to clinic prn if these symptoms worsen or fail to improve as anticipated.

## 2015-02-25 ENCOUNTER — Telehealth: Payer: Self-pay | Admitting: Family Medicine

## 2015-02-25 NOTE — Telephone Encounter (Signed)
Pt would like blood work results °

## 2015-02-26 NOTE — Telephone Encounter (Signed)
Patient is aware 

## 2015-02-26 NOTE — Telephone Encounter (Signed)
His thyroid level is normal. Stay on the current dose of Synthroid

## 2015-03-25 ENCOUNTER — Other Ambulatory Visit: Payer: Self-pay | Admitting: Family Medicine

## 2015-04-19 ENCOUNTER — Telehealth: Payer: Self-pay | Admitting: *Deleted

## 2015-04-19 MED ORDER — ESCITALOPRAM OXALATE 10 MG PO TABS: 10.0000 mg | ORAL_TABLET | Freq: Every day | ORAL | Status: DC

## 2015-04-19 NOTE — Telephone Encounter (Signed)
[facility] Escitalopram 10 mg tablet. Patient currently has #30 with 11 refills.   [id number] [address] [place], [place] [address] [phone]

## 2015-04-19 NOTE — Telephone Encounter (Signed)
Okay per Dr. Clent Ridges to send in a 90 day supply with 3 refills. I did resend script e-scribe.

## 2015-05-04 ENCOUNTER — Ambulatory Visit (INDEPENDENT_AMBULATORY_CARE_PROVIDER_SITE_OTHER): Payer: BLUE CROSS/BLUE SHIELD | Admitting: Family Medicine

## 2015-05-04 ENCOUNTER — Encounter: Payer: Self-pay | Admitting: Family Medicine

## 2015-05-04 VITALS — BP 120/81 | HR 71 | Temp 98.9°F | Ht 68.0 in | Wt 167.0 lb

## 2015-05-04 DIAGNOSIS — J019 Acute sinusitis, unspecified: Secondary | ICD-10-CM | POA: Diagnosis not present

## 2015-05-04 MED ORDER — AZITHROMYCIN 250 MG PO TABS: ORAL_TABLET | ORAL | Status: DC

## 2015-05-04 NOTE — Progress Notes (Signed)
Pre visit review using our clinic review tool, if applicable. No additional management support is needed unless otherwise documented below in the visit note. 

## 2015-05-04 NOTE — Progress Notes (Signed)
   Subjective:    Patient ID: Benjamin Owen, male    DOB: 05/14/1976, 39 y.o.   MRN: [id number]  HPI Here for 2 weeks of stuffy head, PND, and a dry cough. No fever. He tried Afrin sprays but these made him jittery.    Review of Systems  Constitutional: Negative.   HENT: Positive for congestion and postnasal drip. Negative for sore throat.   Eyes: Negative.   Respiratory: Positive for cough.        Objective:   Physical Exam  Constitutional: He appears well-developed and well-nourished.  HENT:  Right Ear: External ear normal.  Left Ear: External ear normal.  Nose: Nose normal.  Mouth/Throat: Oropharynx is clear and moist.  Eyes: Conjunctivae are normal.  Pulmonary/Chest: Effort normal and breath sounds normal.  Lymphadenopathy:    He has no cervical adenopathy.          Assessment & Plan:  Sinusitis, treat with a Zpack. I also suggested Flonase sprays and Mucinex

## 2015-07-26 ENCOUNTER — Other Ambulatory Visit: Payer: Self-pay | Admitting: Family Medicine

## 2015-10-06 ENCOUNTER — Encounter: Payer: Self-pay | Admitting: Family Medicine

## 2015-10-06 ENCOUNTER — Ambulatory Visit (INDEPENDENT_AMBULATORY_CARE_PROVIDER_SITE_OTHER): Payer: BLUE CROSS/BLUE SHIELD | Admitting: Family Medicine

## 2015-10-06 VITALS — BP 123/74 | HR 84 | Temp 98.9°F | Ht 68.0 in | Wt 174.0 lb

## 2015-10-06 DIAGNOSIS — J019 Acute sinusitis, unspecified: Secondary | ICD-10-CM

## 2015-10-06 MED ORDER — AZITHROMYCIN 250 MG PO TABS: ORAL_TABLET | ORAL | Status: DC

## 2015-10-06 NOTE — Progress Notes (Signed)
Pre visit review using our clinic review tool, if applicable. No additional management support is needed unless otherwise documented below in the visit note. 

## 2015-10-06 NOTE — Progress Notes (Signed)
   Subjective:    Patient ID: Benjamin Owen, male    DOB: 06/06/1976, 40 y.o.   MRN: [id number]  HPI Here for one week of pressure and pain around the left eye and in the left ear. Some PND. No fever.    Review of Systems  Constitutional: Negative.   HENT: Positive for congestion, ear pain, postnasal drip and sinus pressure. Negative for sore throat.   Eyes: Negative.   Respiratory: Negative.        Objective:   Physical Exam  Constitutional: He appears well-developed and well-nourished.  HENT:  Right Ear: External ear normal.  Left Ear: External ear normal.  Nose: Nose normal.  Mouth/Throat: Oropharynx is clear and moist.  Eyes: Conjunctivae are normal.  Neck: Neck supple. No thyromegaly present.  Pulmonary/Chest: Effort normal and breath sounds normal.  Lymphadenopathy:    He has no cervical adenopathy.          Assessment & Plan:  Sinusitis, treat with a Zpack

## 2015-10-21 ENCOUNTER — Other Ambulatory Visit: Payer: Self-pay | Admitting: Family Medicine

## 2016-01-05 ENCOUNTER — Encounter: Payer: Self-pay | Admitting: Adult Health

## 2016-01-05 ENCOUNTER — Other Ambulatory Visit (HOSPITAL_COMMUNITY)
Admission: RE | Admit: 2016-01-05 | Discharge: 2016-01-05 | Disposition: A | Discharge status: Home / Self Care | Payer: BLUE CROSS/BLUE SHIELD | Source: Ambulatory Visit | Attending: Adult Health | Admitting: Adult Health

## 2016-01-05 ENCOUNTER — Ambulatory Visit (INDEPENDENT_AMBULATORY_CARE_PROVIDER_SITE_OTHER): Payer: BLUE CROSS/BLUE SHIELD | Admitting: Adult Health

## 2016-01-05 DIAGNOSIS — N410 Acute prostatitis: Secondary | ICD-10-CM | POA: Diagnosis not present

## 2016-01-05 DIAGNOSIS — R350 Frequency of micturition: Secondary | ICD-10-CM | POA: Diagnosis not present

## 2016-01-05 DIAGNOSIS — Z113 Encounter for screening for infections with a predominantly sexual mode of transmission: Secondary | ICD-10-CM | POA: Insufficient documentation

## 2016-01-05 LAB — POCT URINALYSIS DIPSTICK
Bilirubin, UA: NEGATIVE
Glucose, UA: NEGATIVE
Ketones, UA: NEGATIVE
Leukocytes, UA: NEGATIVE
NITRITE UA: NEGATIVE
PH UA: 7
Protein, UA: NEGATIVE
RBC UA: NEGATIVE
Spec Grav, UA: 1.02
UROBILINOGEN UA: 0.2

## 2016-01-05 MED ORDER — SULFAMETHOXAZOLE-TRIMETHOPRIM 800-160 MG PO TABS: 1.0000 | ORAL_TABLET | Freq: Two times a day (BID) | ORAL | Status: DC

## 2016-01-05 NOTE — Patient Instructions (Addendum)
Your exam is consistent with prostatitis.   I have sent in a prescription for bactrim. Take this twice a day for 6 weeks. Make sure you finish the antibiotics.   I would also suggest using a pro biotic while taking this medication.   Follow up if no improvement  Prostatitis The prostate gland is about the size and shape of a walnut. It is located just below your bladder. It produces one of the components of semen, which is made up of sperm and the fluids that help nourish and transport it out from the testicles. Prostatitis is inflammation of the prostate gland.  There are four types of prostatitis:  Acute bacterial prostatitis. This is the least common type of prostatitis. It starts quickly and usually is associated with a bladder infection, high fever, and shaking chills. It can occur at any age.  Chronic bacterial prostatitis. This is a persistent bacterial infection in the prostate. It usually develops from repeated acute bacterial prostatitis or acute bacterial prostatitis that was not properly treated. It can occur in men of any age but is most common in middle-aged men whose prostate has begun to enlarge. The symptoms are not as severe as those in acute bacterial prostatitis. Discomfort in the part of your body that is in front of your rectum and below your scrotum (perineum), lower abdomen, or in the head of your penis (glans) may represent your primary discomfort.  Chronic prostatitis (nonbacterial). This is the most common type of prostatitis. It is inflammation of the prostate gland that is not caused by a bacterial infection. The cause is unknown and may be associated with a viral infection or autoimmune disorder.  Prostatodynia (pelvic floor disorder). This is associated with increased muscular tone in the pelvis surrounding the prostate. CAUSES The causes of bacterial prostatitis are bacterial infection. The causes of the other types of prostatitis are unknown.  SYMPTOMS   Symptoms can vary depending upon the type of prostatitis that exists. There can also be overlap in symptoms. Possible symptoms for each type of prostatitis are listed below. Acute Bacterial Prostatitis  Painful urination.  Fever or chills.  Muscle or joint pains.  Low back pain.  Low abdominal pain.  Inability to empty bladder completely. Chronic Bacterial Prostatitis, Chronic Nonbacterial Prostatitis, and Prostatodynia  Sudden urge to urinate.  Frequent urination.  Difficulty starting urine stream.  Weak urine stream.  Discharge from the urethra.  Dribbling after urination.  Rectal pain.  Pain in the testicles, penis, or tip of the penis.  Pain in the perineum.  Problems with sexual function.  Painful ejaculation.  Bloody semen. DIAGNOSIS  In order to diagnose prostatitis, your health care provider will ask about your symptoms. One or more urine samples will be taken and tested (urinalysis). If the urinalysis result is negative for bacteria, your health care provider may use a finger to feel your prostate (digital rectal exam). This exam helps your health care provider determine if your prostate is swollen and tender. It will also produce a specimen of semen that can be analyzed. TREATMENT  Treatment for prostatitis depends on the cause. If a bacterial infection is the cause, it can be treated with antibiotic medicine. In cases of chronic bacterial prostatitis, the use of antibiotics for up to 1 month or 6 weeks may be necessary. Your health care provider may instruct you to take sitz baths to help relieve pain. A sitz bath is a bath of hot water in which your hips and buttocks are  under water. This relaxes the pelvic floor muscles and often helps to relieve the pressure on your prostate. HOME CARE INSTRUCTIONS   Take all medicines as directed by your health care provider.  Take sitz baths as directed by your health care provider. SEEK MEDICAL CARE IF:   Your  symptoms get worse, not better.  You have a fever. SEEK IMMEDIATE MEDICAL CARE IF:   You have chills.  You feel nauseous or vomit.  You feel lightheaded or faint.  You are unable to urinate.  You have blood or blood clots in your urine. MAKE SURE YOU:  Understand these instructions.  Will watch your condition.  Will get help right away if you are not doing well or get worse.   This information is not intended to replace advice given to you by your health care provider. Make sure you discuss any questions you have with your health care provider.   Document Released: 06/23/2000 Document Revised: 07/17/2014 Document Reviewed: 01/13/2013 Elsevier Interactive Patient Education [employer]2016 [employer]

## 2016-01-05 NOTE — Progress Notes (Signed)
Subjective:    Patient ID: Benjamin Owen, male    DOB: 08/24/1975, 40 y.o.   MRN: [id number]  HPI  40 year old male patient of Dr. Clent RidgesFry who I am seeing in his absence. Benjamin Owen reports that for the last few weeks he has had abdominal discomfort, diarrhea, burning with urination, testicular pain.   He reports that he has had prostate infection in the past and this is what it can feel like.   Review of Systems  Constitutional: Positive for fatigue. Negative for fever.  Respiratory: Negative.   Cardiovascular: Negative.   Gastrointestinal: Positive for nausea, abdominal pain, diarrhea and rectal pain. Negative for vomiting, constipation and blood in stool.  Genitourinary: Positive for frequency and testicular pain. Negative for discharge, penile swelling and penile pain.  All other systems reviewed and are negative.  Past Medical History  Diagnosis Date  . Allergy   . GERD (gastroesophageal reflux disease)   . Carpal tunnel syndrome   . 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 Chronic headaches   . Anxiety   . Fatigue   . Anal fissure     Social History   Social History  . Marital Status: Married    Spouse Name: N/A  . Number of Children: 2  . Years of Education: N/A   Occupational History  . financial planner    Social History Main Topics  . Smoking status: Never Smoker   . Smokeless tobacco: Never Used  . Alcohol Use: 0.0 oz/week    0 Standard drinks or equivalent per week     Comment: maybe once a month  . Drug Use: No  . Sexual Activity: Not on file   Other Topics Concern  . Not on file   Social History Narrative    Past Surgical History  Procedure Laterality Date  . Appendectomy  07/10/2005  . Esophagogastroduodenoscopy  01-15-08    per Dr. Jarold MottoPatterson, GERD only   . Tonsillectomy    . Lasik      Family History  Problem Relation Age of Onset  . Hyperlipidemia    . Hypertension    .  Heart disease Father   . Liver disease Father     cirrhosis  . Colon cancer Neg Hx     Allergies  Allergen Reactions  . Ciprofloxacin     nausea  . Ultram [Tramadol Hcl] Nausea Only    Current Outpatient Prescriptions on File Prior to Visit  Medication Sig Dispense Refill  . [facility] ALLERGY RELIEF 180 MG tablet TAKE 1 TABLET EVERY DAY 30 tablet 6  . escitalopram (LEXAPRO) 10 MG tablet Take 1 tablet (10 mg total) by mouth daily. 90 tablet 3  . levothyroxine (SYNTHROID, LEVOTHROID) 75 MCG tablet TAKE 1 TABLET (75 MCG TOTAL) BY MOUTH DAILY. 90 tablet 3  . montelukast (SINGULAIR) 10 MG tablet TAKE 1 TABLET AT BEDTIME 90 tablet 1  . Multiple Vitamin (MULTIVITAMIN) capsule Take 1 capsule by mouth daily.      . pantoprazole (PROTONIX) 40 MG tablet TAKE 1 TABLET (40 MG TOTAL) BY MOUTH DAILY. 90 tablet 1   No current facility-administered medications on file prior to visit.    BP 134/82 mmHg  Temp(Src) 98 F (36.7 C) (Oral)  Wt 171 lb 12.8 oz (77.928 kg)       Objective:   Physical Exam  Constitutional: He is oriented to person, place, and time. He appears well-developed and  well-nourished. No distress.  Cardiovascular: Normal rate, regular rhythm, normal heart sounds and intact distal pulses.  Exam reveals no gallop and no friction rub.   No murmur heard. Genitourinary: Rectum normal, testes normal and penis normal. Guaiac negative stool. Prostate is tender.  Neurological: He is alert and oriented to person, place, and time.  Skin: Skin is warm and dry. No rash noted. He is not diaphoretic. No erythema. No pallor.  Psychiatric: He has a normal mood and affect. His behavior is normal. Judgment and thought content normal.  Nursing note and vitals reviewed.      Assessment & Plan:  1. Acute prostatitis - Prostate boggy, warm and tender to touch. Will treat as prostatitis. He has sensitivity to cipro so will use Bactrim for 6 weeks instead - POCT Urinalysis Dipstick - Urine  culture - sulfamethoxazole-trimethoprim (BACTRIM DS,SEPTRA DS) 800-160 MG tablet; Take 1 tablet by mouth 2 (two) times daily.  Dispense: 84 tablet; Refill: 0 - Urine cytology ancillary only - Add probiotic - Follow up if no improvement  Shirline Freesory Xoie Kreuser, NP

## 2016-01-06 LAB — URINE CYTOLOGY ANCILLARY ONLY
Chlamydia: NEGATIVE
Neisseria Gonorrhea: NEGATIVE
TRICH (WINDOWPATH): NEGATIVE

## 2016-01-07 LAB — URINE CULTURE
COLONY COUNT: NO GROWTH
Organism ID, Bacteria: NO GROWTH

## 2016-01-10 ENCOUNTER — Telehealth: Payer: Self-pay | Admitting: Family Medicine

## 2016-01-10 LAB — URINE CYTOLOGY ANCILLARY ONLY: BACTERIAL VAGINITIS: NEGATIVE

## 2016-01-10 NOTE — Telephone Encounter (Signed)
Pt would like to have his lab results from 6/28

## 2016-01-10 NOTE — Telephone Encounter (Signed)
I spoke with pt  

## 2016-01-19 ENCOUNTER — Other Ambulatory Visit: Payer: Self-pay | Admitting: Family Medicine

## 2016-01-19 NOTE — Telephone Encounter (Signed)
Refill sent to pharmacy.   

## 2016-01-20 DIAGNOSIS — F4322 Adjustment disorder with anxiety: Secondary | ICD-10-CM | POA: Diagnosis not present

## 2016-01-24 DIAGNOSIS — F4322 Adjustment disorder with anxiety: Secondary | ICD-10-CM | POA: Diagnosis not present

## 2016-01-28 DIAGNOSIS — F4322 Adjustment disorder with anxiety: Secondary | ICD-10-CM | POA: Diagnosis not present

## 2016-02-09 DIAGNOSIS — F4322 Adjustment disorder with anxiety: Secondary | ICD-10-CM | POA: Diagnosis not present

## 2016-02-29 ENCOUNTER — Encounter: Payer: Self-pay | Admitting: Family Medicine

## 2016-02-29 ENCOUNTER — Ambulatory Visit (INDEPENDENT_AMBULATORY_CARE_PROVIDER_SITE_OTHER): Payer: BLUE CROSS/BLUE SHIELD | Admitting: Family Medicine

## 2016-02-29 VITALS — BP 110/86 | HR 84 | Temp 99.0°F | Ht 68.0 in | Wt 172.3 lb

## 2016-02-29 DIAGNOSIS — E039 Hypothyroidism, unspecified: Secondary | ICD-10-CM | POA: Diagnosis not present

## 2016-02-29 DIAGNOSIS — M545 Low back pain, unspecified: Secondary | ICD-10-CM

## 2016-02-29 LAB — POCT URINALYSIS DIPSTICK
BILIRUBIN UA: NEGATIVE
GLUCOSE UA: NEGATIVE
KETONES UA: NEGATIVE
Leukocytes, UA: NEGATIVE
NITRITE UA: NEGATIVE
PH UA: 5
Protein, UA: NEGATIVE
RBC UA: NEGATIVE
Spec Grav, UA: 1.01
Urobilinogen, UA: 0.2

## 2016-02-29 MED ORDER — CYCLOBENZAPRINE HCL 5 MG PO TABS: 5.0000 mg | ORAL_TABLET | Freq: Every evening | ORAL | 0 refills | Status: DC | PRN

## 2016-02-29 NOTE — Progress Notes (Signed)
HPI:  Acute visit for:  R low back pain: -woke up with this yesterday -mild-mod pain in R lower back -no radiation, fevers, weakness, numbness, hematuria, dysuria -he wants to see if this is his kidneys and wants to check his thyroid as reports he has felt sluggish  -recently treated for prostatitis per his report and all of those symptoms resolved  ROS: See pertinent positives and negatives per HPI.  Past Medical History:  Diagnosis Date  . Allergy   . Anal fissure   . Anxiety   . Carpal tunnel syndrome   . Chronic headaches   . Fatigue   . GERD (gastroesophageal reflux disease)   . 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 APPENDECTOMY  07/10/2005  . ESOPHAGOGASTRODUODENOSCOPY  01-15-08   per Dr. Jarold MottoPatterson, GERD only   . LASIK    . TONSILLECTOMY      Family History  Problem Relation Age of Onset  . Hyperlipidemia    . Hypertension    . Heart disease Father   . Liver disease Father     cirrhosis  . Colon cancer Neg Hx     Social History   Social History  . Marital status: Married    Spouse name: N/A  . Number of children: 2  . 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 Smoking status: Never Smoker  . Smokeless tobacco: Never Used  . Alcohol use 0.0 oz/week     Comment: maybe once a month  . Drug use: No  . Sexual activity: Not Asked   Other Topics Concern  . None   Social History Narrative  . None     Current Outpatient Prescriptions:  .  [facility] ALLERGY RELIEF 180 MG tablet, TAKE 1 TABLET EVERY DAY, Disp: 30 tablet, Rfl: 6 .  escitalopram (LEXAPRO) 10 MG tablet, Take 1 tablet (10 mg total) by mouth daily., Disp: 90 tablet, Rfl: 3 .  levothyroxine (SYNTHROID, LEVOTHROID) 75 MCG tablet, TAKE 1 TABLET (75 MCG TOTAL) BY MOUTH DAILY., Disp: 90 tablet, Rfl: 3 .   montelukast (SINGULAIR) 10 MG tablet, TAKE 1 TABLET AT BEDTIME, Disp: 90 tablet, Rfl: 0 .  Multiple Vitamin (MULTIVITAMIN) capsule, Take 1 capsule by mouth daily.  , Disp: , Rfl:  .  pantoprazole (PROTONIX) 40 MG tablet, TAKE 1 TABLET (40 MG TOTAL) BY MOUTH DAILY., Disp: 90 tablet, Rfl: 0 .  cyclobenzaprine (FLEXERIL) 5 MG tablet, Take 1 tablet (5 mg total) by mouth at bedtime as needed for muscle spasms., Disp: 20 tablet, Rfl: 0  EXAM:  Vitals:   02/29/16 1612  BP: 110/86  Pulse: 84  Temp: 99 F (37.2 C)    Body mass index is 26.2 kg/m.  GENERAL: vitals reviewed and listed above, alert, oriented, appears well hydrated and in no acute distress  HEENT: atraumatic, conjunttiva clear, no obvious abnormalities on inspection of external nose and ears  NECK: no obvious masses on inspection  LUNGS: clear to auscultation bilaterally, no wheezes, rales or rhonchi, good air movement  CV: HRRR, no peripheral edema  ABD: BS+, soft, NTTP, No CVA TTP  MS: moves all extremities without noticeable abnormality, normal inspection of the back, TTP in the R upper lumbar paraspinal muscles, o/w normal exam  PSYCH: pleasant and cooperative, no obvious depression or anxiety  ASSESSMENT AND PLAN:  Discussed the following assessment and plan:  Right-sided low back pain without sciatica - Plan: Basic metabolic panel, POC Urinalysis Dipstick, cyclobenzaprine (FLEXERIL) 5 MG tablet  Hypothyroidism, unspecified hypothyroidism type - Plan: TSH  -we discussed possible serious and likely etiologies, workup and treatment, treatment risks and return precuations -urine looks good and suspect musculoskeletal pain - will try heat, hep, muscle relaxer, prn analgesic -labs per his request -follow up 1 month or as needed -Patient advised to return or notify a doctor immediately if symptoms worsen or persist or new concerns arise.  Patient Instructions  BEFORE YOU LEAVE: -udip with micro if  abnormal -labs -follow up 1 month -low back exercises  We have ordered labs or studies at this visit. It can take up to 1-2 weeks for results and processing. IF results require follow up or explanation, we will call you with instructions. Clinically stable results will be released to your [facility]. If you have not heard from us or cannot find your results in [facility] in 2 weeks please contact our office at [phone].  If you are not yet signed up for [facility], please consider signing up.  For the back pain: -heat for 15 minutes twice daily -muscle relaxer at night for 5-7 nights, then as needed -tylenol or aleve if needed for pain per instructions -exercises provided 4 days per week -f/u in 1 month or sooner if worsening or other cnocerns           Kriste BasqueKIM, Marina Desire R., DO

## 2016-02-29 NOTE — Patient Instructions (Signed)
BEFORE YOU LEAVE: -udip with micro if abnormal -labs -follow up 1 month -low back exercises  We have ordered labs or studies at this visit. It can take up to 1-2 weeks for results and processing. IF results require follow up or explanation, we will call you with instructions. Clinically stable results will be released to your [facility]. If you have not heard from us or cannot find your results in [facility] in 2 weeks please contact our office at [phone].  If you are not yet signed up for [facility], please consider signing up.  For the back pain: -heat for 15 minutes twice daily -muscle relaxer at night for 5-7 nights, then as needed -tylenol or aleve if needed for pain per instructions -exercises provided 4 days per week -f/u in 1 month or sooner if worsening or other cnocerns

## 2016-02-29 NOTE — Progress Notes (Signed)
Pre visit review using our clinic review tool, if applicable. No additional management support is needed unless otherwise documented below in the visit note. 

## 2016-03-01 LAB — BASIC METABOLIC PANEL
BUN: 13 mg/dL (ref 6–23)
CALCIUM: 9.2 mg/dL (ref 8.4–10.5)
CHLORIDE: 101 meq/L (ref 96–112)
CO2: 31 meq/L (ref 19–32)
CREATININE: 0.93 mg/dL (ref 0.40–1.50)
GFR: 96.16 mL/min (ref >60.00)
Glucose, Bld: 111 mg/dL — ABNORMAL HIGH (ref 70–99)
Potassium: 3.9 mEq/L (ref 3.5–5.1)
SODIUM: 138 meq/L (ref 135–145)

## 2016-03-01 LAB — TSH: TSH: 2.43 u[IU]/mL (ref 0.35–4.50)

## 2016-03-15 DIAGNOSIS — M9902 Segmental and somatic dysfunction of thoracic region: Secondary | ICD-10-CM | POA: Diagnosis not present

## 2016-03-15 DIAGNOSIS — M546 Pain in thoracic spine: Secondary | ICD-10-CM | POA: Diagnosis not present

## 2016-03-15 DIAGNOSIS — M545 Low back pain: Secondary | ICD-10-CM | POA: Diagnosis not present

## 2016-03-15 DIAGNOSIS — M9903 Segmental and somatic dysfunction of lumbar region: Secondary | ICD-10-CM | POA: Diagnosis not present

## 2016-03-20 DIAGNOSIS — M546 Pain in thoracic spine: Secondary | ICD-10-CM | POA: Diagnosis not present

## 2016-03-20 DIAGNOSIS — M9902 Segmental and somatic dysfunction of thoracic region: Secondary | ICD-10-CM | POA: Diagnosis not present

## 2016-03-20 DIAGNOSIS — M545 Low back pain: Secondary | ICD-10-CM | POA: Diagnosis not present

## 2016-03-20 DIAGNOSIS — M9903 Segmental and somatic dysfunction of lumbar region: Secondary | ICD-10-CM | POA: Diagnosis not present

## 2016-03-22 DIAGNOSIS — M546 Pain in thoracic spine: Secondary | ICD-10-CM | POA: Diagnosis not present

## 2016-03-22 DIAGNOSIS — M9903 Segmental and somatic dysfunction of lumbar region: Secondary | ICD-10-CM | POA: Diagnosis not present

## 2016-03-22 DIAGNOSIS — M545 Low back pain: Secondary | ICD-10-CM | POA: Diagnosis not present

## 2016-03-22 DIAGNOSIS — M9902 Segmental and somatic dysfunction of thoracic region: Secondary | ICD-10-CM | POA: Diagnosis not present

## 2016-03-27 DIAGNOSIS — M9903 Segmental and somatic dysfunction of lumbar region: Secondary | ICD-10-CM | POA: Diagnosis not present

## 2016-03-27 DIAGNOSIS — M9902 Segmental and somatic dysfunction of thoracic region: Secondary | ICD-10-CM | POA: Diagnosis not present

## 2016-03-27 DIAGNOSIS — M546 Pain in thoracic spine: Secondary | ICD-10-CM | POA: Diagnosis not present

## 2016-03-27 DIAGNOSIS — M545 Low back pain: Secondary | ICD-10-CM | POA: Diagnosis not present

## 2016-03-31 ENCOUNTER — Ambulatory Visit: Payer: BLUE CROSS/BLUE SHIELD | Admitting: Family Medicine

## 2016-03-31 ENCOUNTER — Telehealth: Payer: Self-pay | Admitting: Family Medicine

## 2016-03-31 DIAGNOSIS — M545 Low back pain: Secondary | ICD-10-CM | POA: Diagnosis not present

## 2016-03-31 DIAGNOSIS — M546 Pain in thoracic spine: Secondary | ICD-10-CM | POA: Diagnosis not present

## 2016-03-31 DIAGNOSIS — M9902 Segmental and somatic dysfunction of thoracic region: Secondary | ICD-10-CM | POA: Diagnosis not present

## 2016-03-31 DIAGNOSIS — Z0289 Encounter for other administrative examinations: Secondary | ICD-10-CM

## 2016-03-31 DIAGNOSIS — M9903 Segmental and somatic dysfunction of lumbar region: Secondary | ICD-10-CM | POA: Diagnosis not present

## 2016-03-31 NOTE — Telephone Encounter (Signed)
Pt was on schedule for a follow up, tried to reach pt and no answer, would advise pt to reschedule.

## 2016-04-03 DIAGNOSIS — M9903 Segmental and somatic dysfunction of lumbar region: Secondary | ICD-10-CM | POA: Diagnosis not present

## 2016-04-03 DIAGNOSIS — M9902 Segmental and somatic dysfunction of thoracic region: Secondary | ICD-10-CM | POA: Diagnosis not present

## 2016-04-03 DIAGNOSIS — M545 Low back pain: Secondary | ICD-10-CM | POA: Diagnosis not present

## 2016-04-03 DIAGNOSIS — M546 Pain in thoracic spine: Secondary | ICD-10-CM | POA: Diagnosis not present

## 2016-04-05 DIAGNOSIS — M546 Pain in thoracic spine: Secondary | ICD-10-CM | POA: Diagnosis not present

## 2016-04-05 DIAGNOSIS — M9903 Segmental and somatic dysfunction of lumbar region: Secondary | ICD-10-CM | POA: Diagnosis not present

## 2016-04-05 DIAGNOSIS — M9902 Segmental and somatic dysfunction of thoracic region: Secondary | ICD-10-CM | POA: Diagnosis not present

## 2016-04-05 DIAGNOSIS — M545 Low back pain: Secondary | ICD-10-CM | POA: Diagnosis not present

## 2016-04-12 DIAGNOSIS — M9902 Segmental and somatic dysfunction of thoracic region: Secondary | ICD-10-CM | POA: Diagnosis not present

## 2016-04-12 DIAGNOSIS — M546 Pain in thoracic spine: Secondary | ICD-10-CM | POA: Diagnosis not present

## 2016-04-12 DIAGNOSIS — M545 Low back pain: Secondary | ICD-10-CM | POA: Diagnosis not present

## 2016-04-12 DIAGNOSIS — M9903 Segmental and somatic dysfunction of lumbar region: Secondary | ICD-10-CM | POA: Diagnosis not present

## 2016-04-14 DIAGNOSIS — M545 Low back pain: Secondary | ICD-10-CM | POA: Diagnosis not present

## 2016-04-14 DIAGNOSIS — M9902 Segmental and somatic dysfunction of thoracic region: Secondary | ICD-10-CM | POA: Diagnosis not present

## 2016-04-14 DIAGNOSIS — M546 Pain in thoracic spine: Secondary | ICD-10-CM | POA: Diagnosis not present

## 2016-04-14 DIAGNOSIS — M9903 Segmental and somatic dysfunction of lumbar region: Secondary | ICD-10-CM | POA: Diagnosis not present

## 2016-04-17 DIAGNOSIS — M546 Pain in thoracic spine: Secondary | ICD-10-CM | POA: Diagnosis not present

## 2016-04-17 DIAGNOSIS — M545 Low back pain: Secondary | ICD-10-CM | POA: Diagnosis not present

## 2016-04-17 DIAGNOSIS — M9902 Segmental and somatic dysfunction of thoracic region: Secondary | ICD-10-CM | POA: Diagnosis not present

## 2016-04-17 DIAGNOSIS — M9903 Segmental and somatic dysfunction of lumbar region: Secondary | ICD-10-CM | POA: Diagnosis not present

## 2016-04-18 ENCOUNTER — Other Ambulatory Visit: Payer: Self-pay | Admitting: Family Medicine

## 2016-04-19 ENCOUNTER — Other Ambulatory Visit: Payer: Self-pay | Admitting: Family Medicine

## 2016-04-20 ENCOUNTER — Other Ambulatory Visit: Payer: Self-pay | Admitting: Family Medicine

## 2016-04-20 DIAGNOSIS — M542 Cervicalgia: Secondary | ICD-10-CM | POA: Diagnosis not present

## 2016-04-20 DIAGNOSIS — M545 Low back pain: Secondary | ICD-10-CM | POA: Diagnosis not present

## 2016-04-24 DIAGNOSIS — M9902 Segmental and somatic dysfunction of thoracic region: Secondary | ICD-10-CM | POA: Diagnosis not present

## 2016-04-24 DIAGNOSIS — M545 Low back pain: Secondary | ICD-10-CM | POA: Diagnosis not present

## 2016-04-24 DIAGNOSIS — M9903 Segmental and somatic dysfunction of lumbar region: Secondary | ICD-10-CM | POA: Diagnosis not present

## 2016-04-24 DIAGNOSIS — M546 Pain in thoracic spine: Secondary | ICD-10-CM | POA: Diagnosis not present

## 2016-04-26 DIAGNOSIS — M9902 Segmental and somatic dysfunction of thoracic region: Secondary | ICD-10-CM | POA: Diagnosis not present

## 2016-04-26 DIAGNOSIS — M9903 Segmental and somatic dysfunction of lumbar region: Secondary | ICD-10-CM | POA: Diagnosis not present

## 2016-04-26 DIAGNOSIS — M545 Low back pain: Secondary | ICD-10-CM | POA: Diagnosis not present

## 2016-04-26 DIAGNOSIS — M546 Pain in thoracic spine: Secondary | ICD-10-CM | POA: Diagnosis not present

## 2016-04-27 DIAGNOSIS — M545 Low back pain: Secondary | ICD-10-CM | POA: Diagnosis not present

## 2016-04-27 DIAGNOSIS — M542 Cervicalgia: Secondary | ICD-10-CM | POA: Diagnosis not present

## 2016-05-01 ENCOUNTER — Ambulatory Visit (INDEPENDENT_AMBULATORY_CARE_PROVIDER_SITE_OTHER): Payer: BLUE CROSS/BLUE SHIELD | Admitting: Family Medicine

## 2016-05-01 VITALS — BP 123/74 | HR 74 | Temp 97.8°F | Ht 68.0 in | Wt 178.0 lb

## 2016-05-01 DIAGNOSIS — N410 Acute prostatitis: Secondary | ICD-10-CM | POA: Diagnosis not present

## 2016-05-01 DIAGNOSIS — R300 Dysuria: Secondary | ICD-10-CM

## 2016-05-01 LAB — POC URINALSYSI DIPSTICK (AUTOMATED)
Bilirubin, UA: NEGATIVE
Glucose, UA: NEGATIVE
Ketones, UA: NEGATIVE
Leukocytes, UA: NEGATIVE
NITRITE UA: NEGATIVE
PH UA: 6
PROTEIN UA: NEGATIVE
RBC UA: NEGATIVE
SPEC GRAV UA: 1.015
UROBILINOGEN UA: 0.2

## 2016-05-01 NOTE — Progress Notes (Signed)
Pre visit review using our clinic review tool, if applicable. No additional management support is needed unless otherwise documented below in the visit note. 

## 2016-05-02 ENCOUNTER — Encounter: Payer: Self-pay | Admitting: Family Medicine

## 2016-05-02 MED ORDER — DOXYCYCLINE HYCLATE 100 MG PO CAPS: 100.0000 mg | ORAL_CAPSULE | Freq: Two times a day (BID) | ORAL | 0 refills | Status: AC

## 2016-05-02 NOTE — Progress Notes (Signed)
   Subjective:    Patient ID: Benjamin Owen, male    DOB: 08/04/1975, 40 y.o.   MRN: [id number]  HPI Here for one week of body aches, headache, diarrhea, and urgency and burning with urinations. No fever. He was treated for prostatitis earlier this year for similar symptoms. He typically drinks plenty of water.    Review of Systems  Constitutional: Negative for fever.  Respiratory: Negative.   Cardiovascular: Negative.   Gastrointestinal: Negative.   Genitourinary: Positive for dysuria, frequency and urgency. Negative for discharge, flank pain and hematuria.       Objective:   Physical Exam  Constitutional: He appears well-developed and well-nourished. No distress.  Cardiovascular: Normal rate, regular rhythm, normal heart sounds and intact distal pulses.   Pulmonary/Chest: Effort normal and breath sounds normal.  Abdominal: Soft. Bowel sounds are normal. He exhibits no distension and no mass. There is no tenderness. There is no rebound and no guarding.  Genitourinary:  Genitourinary Comments: Prostate is boggy and tender           Assessment & Plan:  Prostatitis, culture the sample. Treat with Doxycycline for 30 days.  Nelwyn SalisburyFRY,STEPHEN A, MD

## 2016-05-03 LAB — URINE CULTURE: Organism ID, Bacteria: NO GROWTH

## 2016-05-04 DIAGNOSIS — M545 Low back pain: Secondary | ICD-10-CM | POA: Diagnosis not present

## 2016-05-04 DIAGNOSIS — M542 Cervicalgia: Secondary | ICD-10-CM | POA: Diagnosis not present

## 2016-05-05 DIAGNOSIS — M545 Low back pain: Secondary | ICD-10-CM | POA: Diagnosis not present

## 2016-05-05 DIAGNOSIS — M546 Pain in thoracic spine: Secondary | ICD-10-CM | POA: Diagnosis not present

## 2016-05-05 DIAGNOSIS — M9903 Segmental and somatic dysfunction of lumbar region: Secondary | ICD-10-CM | POA: Diagnosis not present

## 2016-05-05 DIAGNOSIS — M9902 Segmental and somatic dysfunction of thoracic region: Secondary | ICD-10-CM | POA: Diagnosis not present

## 2016-05-08 DIAGNOSIS — M546 Pain in thoracic spine: Secondary | ICD-10-CM | POA: Diagnosis not present

## 2016-05-08 DIAGNOSIS — M9902 Segmental and somatic dysfunction of thoracic region: Secondary | ICD-10-CM | POA: Diagnosis not present

## 2016-05-08 DIAGNOSIS — M545 Low back pain: Secondary | ICD-10-CM | POA: Diagnosis not present

## 2016-05-08 DIAGNOSIS — M9903 Segmental and somatic dysfunction of lumbar region: Secondary | ICD-10-CM | POA: Diagnosis not present

## 2016-05-09 DIAGNOSIS — M542 Cervicalgia: Secondary | ICD-10-CM | POA: Diagnosis not present

## 2016-05-09 DIAGNOSIS — M545 Low back pain: Secondary | ICD-10-CM | POA: Diagnosis not present

## 2016-05-15 DIAGNOSIS — M546 Pain in thoracic spine: Secondary | ICD-10-CM | POA: Diagnosis not present

## 2016-05-15 DIAGNOSIS — M545 Low back pain: Secondary | ICD-10-CM | POA: Diagnosis not present

## 2016-05-15 DIAGNOSIS — M9903 Segmental and somatic dysfunction of lumbar region: Secondary | ICD-10-CM | POA: Diagnosis not present

## 2016-05-15 DIAGNOSIS — M9902 Segmental and somatic dysfunction of thoracic region: Secondary | ICD-10-CM | POA: Diagnosis not present

## 2016-05-16 DIAGNOSIS — M545 Low back pain: Secondary | ICD-10-CM | POA: Diagnosis not present

## 2016-05-16 DIAGNOSIS — M542 Cervicalgia: Secondary | ICD-10-CM | POA: Diagnosis not present

## 2016-05-22 DIAGNOSIS — M546 Pain in thoracic spine: Secondary | ICD-10-CM | POA: Diagnosis not present

## 2016-05-22 DIAGNOSIS — M9903 Segmental and somatic dysfunction of lumbar region: Secondary | ICD-10-CM | POA: Diagnosis not present

## 2016-05-22 DIAGNOSIS — M9902 Segmental and somatic dysfunction of thoracic region: Secondary | ICD-10-CM | POA: Diagnosis not present

## 2016-05-22 DIAGNOSIS — M545 Low back pain: Secondary | ICD-10-CM | POA: Diagnosis not present

## 2016-05-23 DIAGNOSIS — M545 Low back pain: Secondary | ICD-10-CM | POA: Diagnosis not present

## 2016-05-23 DIAGNOSIS — M542 Cervicalgia: Secondary | ICD-10-CM | POA: Diagnosis not present

## 2016-05-30 DIAGNOSIS — M545 Low back pain: Secondary | ICD-10-CM | POA: Diagnosis not present

## 2016-05-30 DIAGNOSIS — M542 Cervicalgia: Secondary | ICD-10-CM | POA: Diagnosis not present

## 2016-06-05 DIAGNOSIS — M545 Low back pain: Secondary | ICD-10-CM | POA: Diagnosis not present

## 2016-06-05 DIAGNOSIS — M9902 Segmental and somatic dysfunction of thoracic region: Secondary | ICD-10-CM | POA: Diagnosis not present

## 2016-06-05 DIAGNOSIS — M546 Pain in thoracic spine: Secondary | ICD-10-CM | POA: Diagnosis not present

## 2016-06-05 DIAGNOSIS — M9903 Segmental and somatic dysfunction of lumbar region: Secondary | ICD-10-CM | POA: Diagnosis not present

## 2016-06-06 DIAGNOSIS — M545 Low back pain: Secondary | ICD-10-CM | POA: Diagnosis not present

## 2016-06-06 DIAGNOSIS — M542 Cervicalgia: Secondary | ICD-10-CM | POA: Diagnosis not present

## 2016-06-13 DIAGNOSIS — M545 Low back pain: Secondary | ICD-10-CM | POA: Diagnosis not present

## 2016-06-13 DIAGNOSIS — M542 Cervicalgia: Secondary | ICD-10-CM | POA: Diagnosis not present

## 2016-06-22 DIAGNOSIS — M545 Low back pain: Secondary | ICD-10-CM | POA: Diagnosis not present

## 2016-06-22 DIAGNOSIS — M542 Cervicalgia: Secondary | ICD-10-CM | POA: Diagnosis not present

## 2016-06-22 DIAGNOSIS — M9903 Segmental and somatic dysfunction of lumbar region: Secondary | ICD-10-CM | POA: Diagnosis not present

## 2016-06-22 DIAGNOSIS — M546 Pain in thoracic spine: Secondary | ICD-10-CM | POA: Diagnosis not present

## 2016-06-22 DIAGNOSIS — M9902 Segmental and somatic dysfunction of thoracic region: Secondary | ICD-10-CM | POA: Diagnosis not present

## 2016-06-27 DIAGNOSIS — M542 Cervicalgia: Secondary | ICD-10-CM | POA: Diagnosis not present

## 2016-06-27 DIAGNOSIS — M545 Low back pain: Secondary | ICD-10-CM | POA: Diagnosis not present

## 2016-07-13 DIAGNOSIS — J020 Streptococcal pharyngitis: Secondary | ICD-10-CM | POA: Diagnosis not present

## 2016-07-26 ENCOUNTER — Other Ambulatory Visit: Payer: Self-pay | Admitting: Family Medicine

## 2016-08-04 ENCOUNTER — Other Ambulatory Visit: Payer: Self-pay | Admitting: Family Medicine

## 2016-08-25 ENCOUNTER — Other Ambulatory Visit: Payer: Self-pay | Admitting: Family Medicine

## 2016-09-26 ENCOUNTER — Telehealth: Payer: Self-pay | Admitting: Family Medicine

## 2016-09-26 DIAGNOSIS — D485 Neoplasm of uncertain behavior of skin: Secondary | ICD-10-CM | POA: Diagnosis not present

## 2016-09-26 DIAGNOSIS — L920 Granuloma annulare: Secondary | ICD-10-CM | POA: Diagnosis not present

## 2016-09-26 NOTE — Telephone Encounter (Signed)
Call in Cipro 500 mg bid for 10 days  

## 2016-09-26 NOTE — Telephone Encounter (Signed)
Pt states he is going to [place] and wants to take  Cipro with him [id number] [place], [place] - [phone] .

## 2016-09-26 NOTE — Telephone Encounter (Signed)
Cipro on drug allergy list, okay to fill? 

## 2016-09-26 NOTE — Telephone Encounter (Signed)
Do not call in Cipro. Instead call in Bactrim DS bid for 10 days

## 2016-09-27 MED ORDER — SULFAMETHOXAZOLE-TRIMETHOPRIM 800-160 MG PO TABS
1.0000 | ORAL_TABLET | Freq: Two times a day (BID) | ORAL | 0 refills | Status: DC
Start: 2016-09-27 — End: 2017-06-28

## 2016-09-27 NOTE — Telephone Encounter (Signed)
[facility] and left a voice message for pt with this information.  

## 2016-09-28 DIAGNOSIS — Z23 Encounter for immunization: Secondary | ICD-10-CM | POA: Diagnosis not present

## 2016-11-19 ENCOUNTER — Other Ambulatory Visit: Payer: Self-pay | Admitting: Family Medicine

## 2016-11-24 ENCOUNTER — Other Ambulatory Visit: Payer: Self-pay | Admitting: Family Medicine

## 2017-01-12 DIAGNOSIS — G44201 Tension-type headache, unspecified, intractable: Secondary | ICD-10-CM | POA: Diagnosis not present

## 2017-01-12 DIAGNOSIS — M62838 Other muscle spasm: Secondary | ICD-10-CM | POA: Diagnosis not present

## 2017-01-12 DIAGNOSIS — M2560 Stiffness of unspecified joint, not elsewhere classified: Secondary | ICD-10-CM | POA: Diagnosis not present

## 2017-01-12 DIAGNOSIS — R293 Abnormal posture: Secondary | ICD-10-CM | POA: Diagnosis not present

## 2017-01-19 DIAGNOSIS — R293 Abnormal posture: Secondary | ICD-10-CM | POA: Diagnosis not present

## 2017-01-19 DIAGNOSIS — M2560 Stiffness of unspecified joint, not elsewhere classified: Secondary | ICD-10-CM | POA: Diagnosis not present

## 2017-01-19 DIAGNOSIS — M62838 Other muscle spasm: Secondary | ICD-10-CM | POA: Diagnosis not present

## 2017-01-19 DIAGNOSIS — G44201 Tension-type headache, unspecified, intractable: Secondary | ICD-10-CM | POA: Diagnosis not present

## 2017-01-22 DIAGNOSIS — M2560 Stiffness of unspecified joint, not elsewhere classified: Secondary | ICD-10-CM | POA: Diagnosis not present

## 2017-01-22 DIAGNOSIS — R293 Abnormal posture: Secondary | ICD-10-CM | POA: Diagnosis not present

## 2017-01-22 DIAGNOSIS — G44201 Tension-type headache, unspecified, intractable: Secondary | ICD-10-CM | POA: Diagnosis not present

## 2017-01-22 DIAGNOSIS — M62838 Other muscle spasm: Secondary | ICD-10-CM | POA: Diagnosis not present

## 2017-01-24 DIAGNOSIS — G44201 Tension-type headache, unspecified, intractable: Secondary | ICD-10-CM | POA: Diagnosis not present

## 2017-01-24 DIAGNOSIS — M62838 Other muscle spasm: Secondary | ICD-10-CM | POA: Diagnosis not present

## 2017-01-24 DIAGNOSIS — R293 Abnormal posture: Secondary | ICD-10-CM | POA: Diagnosis not present

## 2017-01-24 DIAGNOSIS — M2560 Stiffness of unspecified joint, not elsewhere classified: Secondary | ICD-10-CM | POA: Diagnosis not present

## 2017-01-26 DIAGNOSIS — M62838 Other muscle spasm: Secondary | ICD-10-CM | POA: Diagnosis not present

## 2017-01-26 DIAGNOSIS — M2560 Stiffness of unspecified joint, not elsewhere classified: Secondary | ICD-10-CM | POA: Diagnosis not present

## 2017-01-26 DIAGNOSIS — G44201 Tension-type headache, unspecified, intractable: Secondary | ICD-10-CM | POA: Diagnosis not present

## 2017-01-26 DIAGNOSIS — R293 Abnormal posture: Secondary | ICD-10-CM | POA: Diagnosis not present

## 2017-01-29 DIAGNOSIS — R293 Abnormal posture: Secondary | ICD-10-CM | POA: Diagnosis not present

## 2017-01-29 DIAGNOSIS — M62838 Other muscle spasm: Secondary | ICD-10-CM | POA: Diagnosis not present

## 2017-01-29 DIAGNOSIS — G44201 Tension-type headache, unspecified, intractable: Secondary | ICD-10-CM | POA: Diagnosis not present

## 2017-01-29 DIAGNOSIS — M2560 Stiffness of unspecified joint, not elsewhere classified: Secondary | ICD-10-CM | POA: Diagnosis not present

## 2017-01-31 DIAGNOSIS — G44201 Tension-type headache, unspecified, intractable: Secondary | ICD-10-CM | POA: Diagnosis not present

## 2017-01-31 DIAGNOSIS — M2560 Stiffness of unspecified joint, not elsewhere classified: Secondary | ICD-10-CM | POA: Diagnosis not present

## 2017-01-31 DIAGNOSIS — M62838 Other muscle spasm: Secondary | ICD-10-CM | POA: Diagnosis not present

## 2017-01-31 DIAGNOSIS — R293 Abnormal posture: Secondary | ICD-10-CM | POA: Diagnosis not present

## 2017-02-06 DIAGNOSIS — M62838 Other muscle spasm: Secondary | ICD-10-CM | POA: Diagnosis not present

## 2017-02-06 DIAGNOSIS — G44201 Tension-type headache, unspecified, intractable: Secondary | ICD-10-CM | POA: Diagnosis not present

## 2017-02-06 DIAGNOSIS — M2560 Stiffness of unspecified joint, not elsewhere classified: Secondary | ICD-10-CM | POA: Diagnosis not present

## 2017-02-06 DIAGNOSIS — R293 Abnormal posture: Secondary | ICD-10-CM | POA: Diagnosis not present

## 2017-02-07 DIAGNOSIS — M2560 Stiffness of unspecified joint, not elsewhere classified: Secondary | ICD-10-CM | POA: Diagnosis not present

## 2017-02-07 DIAGNOSIS — M62838 Other muscle spasm: Secondary | ICD-10-CM | POA: Diagnosis not present

## 2017-02-07 DIAGNOSIS — R293 Abnormal posture: Secondary | ICD-10-CM | POA: Diagnosis not present

## 2017-02-07 DIAGNOSIS — G44201 Tension-type headache, unspecified, intractable: Secondary | ICD-10-CM | POA: Diagnosis not present

## 2017-02-08 ENCOUNTER — Other Ambulatory Visit: Payer: Self-pay | Admitting: Family Medicine

## 2017-02-09 DIAGNOSIS — M2560 Stiffness of unspecified joint, not elsewhere classified: Secondary | ICD-10-CM | POA: Diagnosis not present

## 2017-02-09 DIAGNOSIS — G44201 Tension-type headache, unspecified, intractable: Secondary | ICD-10-CM | POA: Diagnosis not present

## 2017-02-09 DIAGNOSIS — M62838 Other muscle spasm: Secondary | ICD-10-CM | POA: Diagnosis not present

## 2017-02-09 DIAGNOSIS — R293 Abnormal posture: Secondary | ICD-10-CM | POA: Diagnosis not present

## 2017-02-13 DIAGNOSIS — G44201 Tension-type headache, unspecified, intractable: Secondary | ICD-10-CM | POA: Diagnosis not present

## 2017-02-13 DIAGNOSIS — R293 Abnormal posture: Secondary | ICD-10-CM | POA: Diagnosis not present

## 2017-02-13 DIAGNOSIS — M2560 Stiffness of unspecified joint, not elsewhere classified: Secondary | ICD-10-CM | POA: Diagnosis not present

## 2017-02-13 DIAGNOSIS — M62838 Other muscle spasm: Secondary | ICD-10-CM | POA: Diagnosis not present

## 2017-02-14 DIAGNOSIS — M2560 Stiffness of unspecified joint, not elsewhere classified: Secondary | ICD-10-CM | POA: Diagnosis not present

## 2017-02-14 DIAGNOSIS — M62838 Other muscle spasm: Secondary | ICD-10-CM | POA: Diagnosis not present

## 2017-02-14 DIAGNOSIS — R293 Abnormal posture: Secondary | ICD-10-CM | POA: Diagnosis not present

## 2017-02-14 DIAGNOSIS — G44201 Tension-type headache, unspecified, intractable: Secondary | ICD-10-CM | POA: Diagnosis not present

## 2017-02-16 DIAGNOSIS — R293 Abnormal posture: Secondary | ICD-10-CM | POA: Diagnosis not present

## 2017-02-16 DIAGNOSIS — G44201 Tension-type headache, unspecified, intractable: Secondary | ICD-10-CM | POA: Diagnosis not present

## 2017-02-16 DIAGNOSIS — M2560 Stiffness of unspecified joint, not elsewhere classified: Secondary | ICD-10-CM | POA: Diagnosis not present

## 2017-02-16 DIAGNOSIS — M62838 Other muscle spasm: Secondary | ICD-10-CM | POA: Diagnosis not present

## 2017-02-27 ENCOUNTER — Other Ambulatory Visit: Payer: Self-pay | Admitting: Family Medicine

## 2017-02-28 DIAGNOSIS — M62838 Other muscle spasm: Secondary | ICD-10-CM | POA: Diagnosis not present

## 2017-02-28 DIAGNOSIS — G44201 Tension-type headache, unspecified, intractable: Secondary | ICD-10-CM | POA: Diagnosis not present

## 2017-02-28 DIAGNOSIS — Z008 Encounter for other general examination: Secondary | ICD-10-CM | POA: Diagnosis not present

## 2017-02-28 DIAGNOSIS — R293 Abnormal posture: Secondary | ICD-10-CM | POA: Diagnosis not present

## 2017-02-28 DIAGNOSIS — M2560 Stiffness of unspecified joint, not elsewhere classified: Secondary | ICD-10-CM | POA: Diagnosis not present

## 2017-03-07 DIAGNOSIS — M2560 Stiffness of unspecified joint, not elsewhere classified: Secondary | ICD-10-CM | POA: Diagnosis not present

## 2017-03-07 DIAGNOSIS — M62838 Other muscle spasm: Secondary | ICD-10-CM | POA: Diagnosis not present

## 2017-03-07 DIAGNOSIS — G44201 Tension-type headache, unspecified, intractable: Secondary | ICD-10-CM | POA: Diagnosis not present

## 2017-03-07 DIAGNOSIS — R293 Abnormal posture: Secondary | ICD-10-CM | POA: Diagnosis not present

## 2017-03-29 ENCOUNTER — Encounter: Payer: Self-pay | Admitting: Family Medicine

## 2017-05-21 ENCOUNTER — Other Ambulatory Visit: Payer: Self-pay | Admitting: Family Medicine

## 2017-06-27 ENCOUNTER — Other Ambulatory Visit: Payer: Self-pay | Admitting: Family Medicine

## 2017-06-27 NOTE — Telephone Encounter (Signed)
Called pt.

## 2017-06-28 ENCOUNTER — Encounter: Payer: Self-pay | Admitting: Family Medicine

## 2017-06-28 ENCOUNTER — Ambulatory Visit (INDEPENDENT_AMBULATORY_CARE_PROVIDER_SITE_OTHER): Payer: BLUE CROSS/BLUE SHIELD | Admitting: Family Medicine

## 2017-06-28 VITALS — BP 130/80 | HR 68 | Temp 98.4°F | Wt 169.8 lb

## 2017-06-28 DIAGNOSIS — E039 Hypothyroidism, unspecified: Secondary | ICD-10-CM

## 2017-06-28 LAB — TSH: TSH: 6.51 u[IU]/mL — ABNORMAL HIGH (ref 0.35–4.50)

## 2017-06-28 MED ORDER — LEVOTHYROXINE SODIUM 75 MCG PO TABS: 75.0000 ug | ORAL_TABLET | Freq: Every day | ORAL | 3 refills | Status: DC

## 2017-06-28 NOTE — Progress Notes (Signed)
   Subjective:    Patient ID: Benjamin Owen, male    DOB: 05/15/1976, 41 y.o.   MRN: [id number]  HPI Here to follow up on his thyroid medication. His last TSH in August was normal. He feels well although he has put on a little weight.   Review of Systems  Constitutional: Negative.   Respiratory: Negative.   Cardiovascular: Negative.   Endocrine: Negative.   Neurological: Negative.        Objective:   Physical Exam  Constitutional: He appears well-developed and well-nourished.  Neck: No thyromegaly present.  Cardiovascular: Normal rate, regular rhythm, normal heart sounds and intact distal pulses.  Pulmonary/Chest: Effort normal and breath sounds normal. No respiratory distress. He has no wheezes. He has no rales.  Lymphadenopathy:    He has no cervical adenopathy.          Assessment & Plan:  Hypothyroidism. We will check a TSH today. Gershon CraneStephen Ad Guttman, MD

## 2017-06-29 ENCOUNTER — Ambulatory Visit: Payer: BLUE CROSS/BLUE SHIELD | Admitting: Family Medicine

## 2017-07-02 ENCOUNTER — Telehealth: Payer: Self-pay | Admitting: Family Medicine

## 2017-07-02 NOTE — Telephone Encounter (Signed)
Pt given result of labs per Dr Nicholes Benjamin Owen, His thyroid level is low. Increase the Synthroid to 100 mcg daily. Call in #90 with 3 rf and recheck a TSH in 90 days"; pt verifies understanding.

## 2017-07-05 ENCOUNTER — Other Ambulatory Visit: Payer: Self-pay

## 2017-07-05 MED ORDER — LEVOTHYROXINE SODIUM 100 MCG PO TABS: 100.0000 ug | ORAL_TABLET | Freq: Every day | ORAL | 3 refills | Status: DC

## 2017-07-05 NOTE — Telephone Encounter (Signed)
Pt asked for the Rx to be sent to [facility]. Rx was sent 100 mcg daily. Call in #90 with 3 rf

## 2017-08-07 ENCOUNTER — Encounter: Payer: Self-pay | Admitting: Family Medicine

## 2017-08-07 ENCOUNTER — Ambulatory Visit (INDEPENDENT_AMBULATORY_CARE_PROVIDER_SITE_OTHER): Payer: BLUE CROSS/BLUE SHIELD | Admitting: Family Medicine

## 2017-08-07 VITALS — BP 120/78 | HR 74 | Temp 98.5°F | Wt 171.6 lb

## 2017-08-07 DIAGNOSIS — J018 Other acute sinusitis: Secondary | ICD-10-CM | POA: Diagnosis not present

## 2017-08-07 MED ORDER — METHYLPREDNISOLONE 4 MG PO TBPK: ORAL_TABLET | ORAL | 0 refills | Status: DC

## 2017-08-07 MED ORDER — AZITHROMYCIN 250 MG PO TABS: ORAL_TABLET | ORAL | 0 refills | Status: DC

## 2017-08-07 NOTE — Progress Notes (Signed)
   Subjective:    Patient ID: Benjamin Owen, male    DOB: 05/10/1976, 42 y.o.   MRN: [id number]  HPI Here for one week of left ear pain, headache, PND, and a dry cough. On Allegra and Sudafed.    Review of Systems  Constitutional: Negative.   HENT: Positive for congestion, ear pain, postnasal drip, sinus pressure and sinus pain. Negative for sore throat.   Eyes: Negative.   Respiratory: Positive for cough.        Objective:   Physical Exam  Constitutional: He appears well-developed and well-nourished.  HENT:  Right Ear: External ear normal.  Left Ear: External ear normal.  Nose: Nose normal.  Mouth/Throat: Oropharynx is clear and moist.  Eyes: Conjunctivae are normal.  Neck: No thyromegaly present.  Pulmonary/Chest: Effort normal and breath sounds normal. No respiratory distress. He has no wheezes. He has no rales.  Lymphadenopathy:    He has no cervical adenopathy.          Assessment & Plan:  Sinusitis with eustachian tube dysfunction. Treat with a Zpack and a Medrol dose pack.  Gershon CraneStephen Fry, MD

## 2017-08-12 ENCOUNTER — Other Ambulatory Visit: Payer: Self-pay | Admitting: Family Medicine

## 2017-08-21 ENCOUNTER — Other Ambulatory Visit: Payer: Self-pay | Admitting: Family Medicine

## 2017-08-21 NOTE — Telephone Encounter (Signed)
Last OV 08/07/2017   Last refilled 08/04/2016 disp 90 with 3 refills

## 2017-08-22 ENCOUNTER — Other Ambulatory Visit: Payer: Self-pay | Admitting: Family Medicine

## 2017-11-02 ENCOUNTER — Ambulatory Visit: Payer: Self-pay | Admitting: *Deleted

## 2017-11-02 NOTE — Telephone Encounter (Signed)
I called pt and left a voice message, Dr. Clent RidgesFry is out of the office today, advised pt to schedule a office visit with another provider, must be seen, cannot send in medication request.

## 2017-11-02 NOTE — Telephone Encounter (Signed)
Pt reports productive cough for thick yellowish mucous; onset Monday 10/29/17. States nose "stuffy", headache and "teeth hurt at times." Unsure if febrile but "feel warm sometimes." Mild nausea intermittently.Has been taking Mucinex with minimal relief. Denies any SOB. Seen by Dr. Clent RidgesFry on 08/07/17 for similar symptoms, placed on Z-Pack.  Pt states due to a death in his immediate family he is unable to make appt.. Requesting antibiotic be called in. Home care advise given; please advise on medication request.  [phone] [facility]  if appropriate.    Reason for Disposition . [1] Sinus congestion as part of a cold AND [2] present < 10 days . Cough  Answer Assessment - Initial Assessment Questions 1. LOCATION: "Where does it hurt?"      *No Answer* 2. ONSET: "When did the sinus pain start?"  (e.g., hours, days)      *No Answer* 3. SEVERITY: "How bad is the pain?"   (Scale 1-10; mild, moderate or severe)   - MILD (1-3): doesn't interfere with normal activities    - MODERATE (4-7): interferes with normal activities (e.g., work or school) or awakens from sleep   - SEVERE (8-10): excruciating pain and patient unable to do any normal activities        *No Answer* 4. RECURRENT SYMPTOM: "Have you ever had sinus problems before?" If so, ask: "When was the last time?" and "What happened that time?"      *No Answer* 5. NASAL CONGESTION: "Is the nose blocked?" If so, ask, "Can you open it or must you breathe through the mouth?"     *No Answer* 6. NASAL DISCHARGE: "Do you have discharge from your nose?" If so ask, "What color?"     *No Answer* 7. FEVER: "Do you have a fever?" If so, ask: "What is it, how was it measured, and when did it start?"      *No Answer* 8. OTHER SYMPTOMS: "Do you have any other symptoms?" (e.g., sore throat, cough, earache, difficulty breathing)     *No Answer*  Answer Assessment - Initial Assessment Questions 1. ONSET: "When did the cough begin?"      Monday 10/29/17 2.  SEVERITY: "How bad is the cough today?"      Moderate 3. RESPIRATORY DISTRESS: "Describe your breathing."      "Stuffy" 4. FEVER: "Do you have a fever?" If so, ask: "What is your temperature, how was it measured, and when did it start?"     Unsure, "feel warm at times." 5. SPUTUM: "Describe the color of your sputum" (clear, white, yellow, green)     Thick yellowish 6. HEMOPTYSIS: "Are you coughing up any blood?" If so ask: "How much?" (flecks, streaks, tablespoons, etc.)     no 7. CARDIAC HISTORY: "Do you have any history of heart disease?" (e.g., heart attack, congestive heart failure)      no 8. LUNG HISTORY: "Do you have any history of lung disease?"  (e.g., pulmonary embolus, asthma, emphysema)     no 9. PE RISK FACTORS: "Do you have a history of blood clots?" (or: recent major surgery, recent prolonged travel, bedridden )     no 10. OTHER SYMPTOMS: "Do you have any other symptoms?" (e.g., runny nose, wheezing, chest pain)       Headache, "Teeth hurt at times" nose stuffy, mild sore throat, mild intermittent nausea.  Protocols used: SINUS PAIN OR CONGESTION-A-AH, COUGH - ACUTE PRODUCTIVE-A-AH

## 2017-12-05 ENCOUNTER — Telehealth: Payer: Self-pay | Admitting: Family Medicine

## 2017-12-05 NOTE — Telephone Encounter (Signed)
Sent to PCP to advise 

## 2017-12-05 NOTE — Telephone Encounter (Signed)
Copied from CRM [id number]. Topic: Quick Communication - See Telephone Encounter >> Dec 05, 2017 12:20 PM Terisa Starr wrote: CRM for notification. See Telephone encounter for: 12/05/17.  Patient is requesting to have his thyroid checked. Please advise. He is coming in Friday to see Dr Clent Ridges and would like to try to do it that day. Call back @ [phone]

## 2017-12-06 NOTE — Telephone Encounter (Signed)
We can send him to the lab when he comes in

## 2017-12-06 NOTE — Telephone Encounter (Signed)
Called and spoke with pt. Pt advised and voiced understanding.  

## 2017-12-07 ENCOUNTER — Ambulatory Visit (INDEPENDENT_AMBULATORY_CARE_PROVIDER_SITE_OTHER): Payer: BLUE CROSS/BLUE SHIELD | Admitting: Family Medicine

## 2017-12-07 ENCOUNTER — Encounter: Payer: Self-pay | Admitting: Family Medicine

## 2017-12-07 VITALS — BP 120/80 | HR 80 | Temp 98.1°F | Ht 68.0 in | Wt 173.8 lb

## 2017-12-07 DIAGNOSIS — H6982 Other specified disorders of Eustachian tube, left ear: Secondary | ICD-10-CM | POA: Diagnosis not present

## 2017-12-07 DIAGNOSIS — E039 Hypothyroidism, unspecified: Secondary | ICD-10-CM

## 2017-12-07 LAB — T3, FREE: T3, Free: 3.5 pg/mL (ref 2.3–4.2)

## 2017-12-07 LAB — TSH: TSH: 1.15 u[IU]/mL (ref 0.35–4.50)

## 2017-12-07 LAB — T4, FREE: FREE T4: 1.15 ng/dL (ref 0.60–1.60)

## 2017-12-07 NOTE — Progress Notes (Signed)
   Subjective:    Patient ID: Benjamin Owen, male    DOB: [birth year], 42 y.o.   MRN: [id number]  HPI Here for several weeks of popping in the left ear and a pressure sensation. No pain per se. No headache or dizziness. He uses Allegra and Singulair every day. He uses Flonase sparingly.     Review of Systems  Constitutional: Negative.   HENT: Negative for congestion, ear pain, hearing loss, postnasal drip, sinus pressure, sinus pain and sore throat.   Eyes: Negative.   Respiratory: Negative.   Endocrine: Negative.        Objective:   Physical Exam  Constitutional: He appears well-developed and well-nourished.  HENT:  Right Ear: External ear normal.  Left Ear: External ear normal.  Nose: Nose normal.  Mouth/Throat: Oropharynx is clear and moist.  Eyes: Conjunctivae are normal.  Neck: No thyromegaly present.  Pulmonary/Chest: Effort normal and breath sounds normal.  Lymphadenopathy:    He has no cervical adenopathy.          Assessment & Plan:  He has eustachian tube dysfunction. I advised him to use Flonase every day. Add Sudafed prn. We will check a thyroid panel today.  Gershon Crane, MD

## 2018-02-08 ENCOUNTER — Other Ambulatory Visit: Payer: Self-pay | Admitting: Family Medicine

## 2018-02-10 ENCOUNTER — Other Ambulatory Visit: Payer: Self-pay | Admitting: Family Medicine

## 2018-05-12 ENCOUNTER — Telehealth: Payer: Self-pay | Admitting: Family Medicine

## 2018-05-18 ENCOUNTER — Other Ambulatory Visit: Payer: Self-pay | Admitting: Family Medicine

## 2018-05-23 NOTE — Telephone Encounter (Signed)
Pt following up on refill request. Please advise. °

## 2018-06-20 DIAGNOSIS — M25511 Pain in right shoulder: Secondary | ICD-10-CM | POA: Diagnosis not present

## 2018-06-20 DIAGNOSIS — M79621 Pain in right upper arm: Secondary | ICD-10-CM | POA: Diagnosis not present

## 2018-06-24 DIAGNOSIS — M25511 Pain in right shoulder: Secondary | ICD-10-CM | POA: Diagnosis not present

## 2018-06-24 DIAGNOSIS — M79621 Pain in right upper arm: Secondary | ICD-10-CM | POA: Diagnosis not present

## 2018-06-26 DIAGNOSIS — M25511 Pain in right shoulder: Secondary | ICD-10-CM | POA: Diagnosis not present

## 2018-06-26 DIAGNOSIS — M79621 Pain in right upper arm: Secondary | ICD-10-CM | POA: Diagnosis not present

## 2018-07-01 ENCOUNTER — Other Ambulatory Visit: Payer: Self-pay | Admitting: Family Medicine

## 2018-07-01 ENCOUNTER — Ambulatory Visit (INDEPENDENT_AMBULATORY_CARE_PROVIDER_SITE_OTHER): Payer: BLUE CROSS/BLUE SHIELD | Admitting: Family Medicine

## 2018-07-01 ENCOUNTER — Encounter: Payer: Self-pay | Admitting: Family Medicine

## 2018-07-01 VITALS — BP 128/80 | HR 65 | Temp 98.4°F | Ht 68.0 in | Wt 178.0 lb

## 2018-07-01 DIAGNOSIS — R079 Chest pain, unspecified: Secondary | ICD-10-CM | POA: Diagnosis not present

## 2018-07-01 NOTE — Progress Notes (Signed)
Established Patient Office Visit  Subjective:  Patient ID: Benjamin Owen, male    DOB: 05/27/1976  Age: 42 y.o. MRN: [id number]  CC:  Chief Complaint  Patient presents with  . Generalized Body Aches    HPI Benjamin Owen presents for treatment and evaluation of a 2-day history of cough.  There is no fever chills phlegm reactive airway disease or wheezing.  He is taking Allegra and Singulair for allergy symptoms and postnasal drip.  He does not smoke.  He has a history of reflux.  He is taking Protonix for this.  He is having pain in his chest that is not radiating, nonexertional and there is no shortness of breath diaphoresis nausea or vomiting.  He is having pain intermittently in his arms.  He does not feel well.  A friend told him that if he were having chest pain and not feeling well he needs to be evaluated.  He has a history of anxiety treated with Lexapro.  His mother is still living.  His father passed at age 41 from pulmonary  failure.  He lives with his wife and 1611 and 41-year-old children.  Past Medical History:  Diagnosis Date  . Allergy   . Anal fissure   . Anxiety   . Carpal tunnel syndrome   . Chronic headaches   . Fatigue   . GERD (gastroesophageal reflux disease)   . 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 APPENDECTOMY  07/10/2005  . ESOPHAGOGASTRODUODENOSCOPY  01-15-08   per Dr. Jarold MottoPatterson, GERD only   . LASIK    . TONSILLECTOMY      Family History  Problem Relation Age of Onset  . Hyperlipidemia Unknown   . Hypertension Unknown   . Heart disease Father   . Liver disease Father        cirrhosis  . Colon cancer Neg Hx     Social History   Socioeconomic History  . Marital status: Married    Spouse name: Not on file  . Number of children: 2  . Years of education: Not on file  . Highest education level: Not on file  Occupational  History  . Occupation: Therapist, sportsfinancial planner    Employer: [employer]  Social Needs  . Financial resource strain: Not on file  . Food insecurity:    Worry: Not on file    Inability: Not on file  . Transportation needs:    Medical: Not on file    Non-medical: Not on file  Tobacco Use  . Smoking status: Never Smoker  . Smokeless tobacco: Never Used  Substance and Sexual Activity  . Alcohol use: Yes    Alcohol/week: 0.0 standard drinks    Comment: maybe once a month  . Drug use: No  . Sexual activity: Not on file  Lifestyle  . Physical activity:    Days per week: Not on file    Minutes per session: Not on file  . Stress: Not on file  Relationships  . Social connections:    Talks on phone: Not on file    Gets together: Not on file    Attends religious service: Not on file    Active member of club or organization: Not on file    Attends meetings of clubs or organizations: Not on file    Relationship status: Not on file  . Intimate partner  violence:    Fear of current or ex partner: Not on file    Emotionally abused: Not on file    Physically abused: Not on file    Forced sexual activity: Not on file  Other Topics Concern  . Not on file  Social History Narrative  . Not on file    Outpatient Medications Prior to Visit  Medication Sig Dispense Refill  . [facility] ALLERGY RELIEF 180 MG tablet TAKE 1 TABLET EVERY DAY 30 tablet 6  . escitalopram (LEXAPRO) 10 MG tablet TAKE 1 TABLET BY MOUTH EVERY DAY 90 tablet 1  . levothyroxine (SYNTHROID, LEVOTHROID) 100 MCG tablet Take 1 tablet (100 mcg total) by mouth daily. 90 tablet 3  . montelukast (SINGULAIR) 10 MG tablet TAKE 1 TABLET BY MOUTH EVERYDAY AT BEDTIME 90 tablet 0  . Multiple Vitamin (MULTIVITAMIN) capsule Take 1 capsule by mouth daily.      . pantoprazole (PROTONIX) 40 MG tablet TAKE 1 TABLET BY MOUTH EVERY DAY 90 tablet 0  . azithromycin (ZITHROMAX) 250 MG tablet As directed 6 tablet 0  . methylPREDNISolone (MEDROL  DOSEPAK) 4 MG TBPK tablet As directed 21 tablet 0   No facility-administered medications prior to visit.     Allergies  Allergen Reactions  . Ciprofloxacin     nausea  . Ultram [Tramadol Hcl] Nausea Only    ROS Review of Systems  Constitutional: Negative for chills, diaphoresis, fatigue, fever and unexpected weight change.  HENT: Positive for postnasal drip. Negative for rhinorrhea, sinus pressure and sinus pain.   Eyes: Negative for photophobia and visual disturbance.  Respiratory: Positive for cough. Negative for chest tightness, shortness of breath and wheezing.   Cardiovascular: Positive for chest pain. Negative for leg swelling.  Gastrointestinal: Negative for abdominal pain, nausea and vomiting.  Genitourinary: Negative.   Musculoskeletal: Negative for back pain and gait problem.  Skin: Negative for pallor and rash.  Allergic/Immunologic: Negative for immunocompromised state.  Neurological: Negative for seizures and numbness.  Hematological: Does not bruise/bleed easily.  Psychiatric/Behavioral: The patient is nervous/anxious.       Objective:    Physical Exam  Constitutional: He is oriented to person, place, and time. He appears well-developed and well-nourished. No distress.  HENT:  Head: Normocephalic and atraumatic.  Right Ear: External ear normal.  Left Ear: External ear normal.  Mouth/Throat: Oropharynx is clear and moist.  Eyes: Conjunctivae are normal. Right eye exhibits no discharge. Left eye exhibits no discharge. No scleral icterus.  Neck: Neck supple. No JVD present. No tracheal deviation present. No thyromegaly present.  Cardiovascular: Normal rate, regular rhythm and normal heart sounds.  Pulmonary/Chest: Effort normal and breath sounds normal. No stridor. No respiratory distress. He has no wheezes. He has no rales.  Abdominal: Bowel sounds are normal. He exhibits no distension. There is no abdominal tenderness. There is no rebound.  Musculoskeletal:      Right shoulder: Normal.     Left shoulder: Normal.     Cervical back: He exhibits normal range of motion, no tenderness and no bony tenderness.     Thoracic back: Normal.  Lymphadenopathy:    He has no cervical adenopathy.  Neurological: He is alert and oriented to person, place, and time. He has normal strength.  Skin: Skin is warm and dry. No rash noted. He is not diaphoretic. No erythema.  Psychiatric: His behavior is normal. His mood appears anxious.    Ht 5\' 8"  (1.727 m)   BMI 26.43 kg/m  Wt Readings from  Last 3 Encounters:  12/07/17 173 lb 12.8 oz (78.8 kg)  08/07/17 171 lb 9.6 oz (77.8 kg)  06/28/17 169 lb 12.8 oz (77 kg)   BP Readings from Last 3 Encounters:  12/07/17 120/80  08/07/17 120/78  06/28/17 130/80   Guideline developer:  UpToDate (see UpToDate for funding source) Date Released: June 2014  Health Maintenance Due  Topic Date Due  . HIV Screening  03/17/1992    There are no preventive care reminders to display for this patient.  Lab Results  Component Value Date   TSH 1.15 12/07/2017   Lab Results  Component Value Date   WBC 4.7 11/26/2014   HGB 15.2 11/26/2014   HCT 44.5 11/26/2014   MCV 86.8 11/26/2014   PLT 216.0 11/26/2014   Lab Results  Component Value Date   NA 138 02/29/2016   K 3.9 02/29/2016   CO2 31 02/29/2016   GLUCOSE 111 (H) 02/29/2016   BUN 13 02/29/2016   CREATININE 0.93 02/29/2016   BILITOT 0.6 11/26/2014   ALKPHOS 55 11/26/2014   AST 22 11/26/2014   ALT 20 11/26/2014   PROT 7.1 11/26/2014   ALBUMIN 4.5 11/26/2014   CALCIUM 9.2 02/29/2016   GFR 96.16 02/29/2016   Lab Results  Component Value Date   CHOL 228 (H) 11/26/2014   Lab Results  Component Value Date   HDL 38.10 (L) 11/26/2014   Lab Results  Component Value Date   LDLCALC 150 (H) 11/26/2014   Lab Results  Component Value Date   TRIG 199.0 (H) 11/26/2014   Lab Results  Component Value Date   CHOLHDL 6 11/26/2014   No results found for:  HGBA1C    Assessment & Plan:   Problem List Items Addressed This Visit    None      No orders of the defined types were placed in this encounter.  Patient well take his Protonix twice daily along with a multivitamin follow-up with his primary doctor early part of January.  Patient appears anxious today and I recommended reassurance with a normal EKG.  He may pursue counseling at the discretion of his primary care doctor.  Follow-up: No follow-ups on file.

## 2018-08-05 ENCOUNTER — Other Ambulatory Visit: Payer: Self-pay | Admitting: Family Medicine

## 2018-08-05 NOTE — Telephone Encounter (Signed)
Copied from CRM [id number]. Topic: Quick Communication - Rx Refill/Question >> Aug 05, 2018  9:48 AM Jaquita Rector A wrote: Medication: pantoprazole (PROTONIX) 40 MG tablet   Patient ran out due to Dr orders to take 2 tabs instead of 1  Has the patient contacted their pharmacy? Yes.   (Agent: If no, request that the patient contact the pharmacy for the refill.) (Agent: If yes, when and what did the pharmacy advise?)  Preferred Pharmacy (with phone number or street name): [id number] - [place], [place] - [address] [phone] (Phone) [phone] (Fax)    Agent: Please be advised that RX refills may take up to 3 business days. We ask that you follow-up with your pharmacy.

## 2018-08-05 NOTE — Telephone Encounter (Signed)
Pt was told by Dr. Doreene Burke in December to take the protonix BID and follow up with PCP in early Jan.  Pt is now out of his meds and would like to stay on BID.  Please advise of refill thanks

## 2018-08-06 ENCOUNTER — Encounter: Payer: Self-pay | Admitting: Family Medicine

## 2018-08-06 ENCOUNTER — Other Ambulatory Visit: Payer: Self-pay | Admitting: Family Medicine

## 2018-08-06 ENCOUNTER — Ambulatory Visit (INDEPENDENT_AMBULATORY_CARE_PROVIDER_SITE_OTHER): Payer: BLUE CROSS/BLUE SHIELD | Admitting: Family Medicine

## 2018-08-06 VITALS — BP 124/82 | HR 84 | Temp 97.9°F | Wt 174.4 lb

## 2018-08-06 DIAGNOSIS — K219 Gastro-esophageal reflux disease without esophagitis: Secondary | ICD-10-CM | POA: Diagnosis not present

## 2018-08-06 MED ORDER — PANTOPRAZOLE SODIUM 40 MG PO TBEC: 40.0000 mg | DELAYED_RELEASE_TABLET | Freq: Every day | ORAL | 3 refills | Status: DC

## 2018-08-06 MED ORDER — MONTELUKAST SODIUM 10 MG PO TABS: ORAL_TABLET | ORAL | 3 refills | Status: DC

## 2018-08-06 MED ORDER — FAMOTIDINE 40 MG PO TABS: 40.0000 mg | ORAL_TABLET | Freq: Every day | ORAL | 3 refills | Status: DC

## 2018-08-06 NOTE — Telephone Encounter (Signed)
Pt here for an appt today.

## 2018-08-06 NOTE — Progress Notes (Signed)
   Subjective:    Patient ID: Benjamin Owen, male    DOB: Jan 25, 1976, 43 y.o.   MRN: [id number]  HPI Here to discuss his GERD. He has been taking protonix once a day in the morning for some time and he notes he often has breakthrough heartburn in the evenings. Sometimes food seems to stop part way down when he eats, then it passes a few minutes later. No N or V.    Review of Systems  Constitutional: Negative.   HENT: Positive for trouble swallowing.   Respiratory: Negative.   Cardiovascular: Negative.   Gastrointestinal: Negative.        Objective:   Physical Exam Constitutional:      Appearance: Normal appearance.  Cardiovascular:     Rate and Rhythm: Normal rate and regular rhythm.     Pulses: Normal pulses.     Heart sounds: Normal heart sounds.  Pulmonary:     Effort: Pulmonary effort is normal.     Breath sounds: Normal breath sounds.  Neurological:     Mental Status: He is alert.           Assessment & Plan:  For the GERD he will add Pepcid 40 mg in the evenings. Recheck prn. Gershon Crane, MD

## 2018-08-07 NOTE — Telephone Encounter (Signed)
Dr. Clent Ridges please advise on change in medication.  Thanks

## 2018-08-09 MED ORDER — PANTOPRAZOLE SODIUM 40 MG PO TBEC: 40.0000 mg | DELAYED_RELEASE_TABLET | Freq: Two times a day (BID) | ORAL | 3 refills | Status: DC

## 2018-08-09 NOTE — Telephone Encounter (Signed)
Medication has been increased to BID and sent to the pharmacy.

## 2018-08-09 NOTE — Telephone Encounter (Signed)
Cancel the Pepcid order. Instead increase the Protonix to 40 mg bid, call in #[phone] with 3 rf

## 2018-08-30 ENCOUNTER — Ambulatory Visit (INDEPENDENT_AMBULATORY_CARE_PROVIDER_SITE_OTHER): Payer: BLUE CROSS/BLUE SHIELD | Admitting: Adult Health

## 2018-08-30 ENCOUNTER — Encounter: Payer: Self-pay | Admitting: Adult Health

## 2018-08-30 VITALS — BP 136/92 | Temp 98.9°F | Wt 171.0 lb

## 2018-08-30 DIAGNOSIS — J0100 Acute maxillary sinusitis, unspecified: Secondary | ICD-10-CM

## 2018-08-30 MED ORDER — DOXYCYCLINE HYCLATE 100 MG PO CAPS: 100.0000 mg | ORAL_CAPSULE | Freq: Two times a day (BID) | ORAL | 0 refills | Status: DC

## 2018-08-30 NOTE — Progress Notes (Signed)
Subjective:    Patient ID: Benjamin Owen, male    DOB: [birth year], 43 y.o.   MRN: [id number]  Sinusitis  This is a new problem. The current episode started 1 to 4 weeks ago (two weeks). There has been no fever. Associated symptoms include congestion, coughing, headaches, sinus pressure and a sore throat. Pertinent negatives include no chills. Past treatments include oral decongestants and acetaminophen. The treatment provided mild relief.      Review of Systems  Constitutional: Positive for fatigue. Negative for chills.  HENT: Positive for congestion, postnasal drip, rhinorrhea, sinus pressure, sinus pain and sore throat. Negative for trouble swallowing.   Respiratory: Positive for cough and chest tightness. Negative for wheezing.   Cardiovascular: Negative.   Gastrointestinal: Positive for nausea.  Skin: Negative.   Neurological: Positive for headaches.   Past Medical History:  Diagnosis Date  . Allergy   . Anal fissure   . Anxiety   . Carpal tunnel syndrome   . Chronic headaches   . Fatigue   . GERD (gastroesophageal reflux disease)   . 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 Marital status: Married    Spouse name: Not on file  . Number of children: 2  . Years of education: Not on file  . Highest education level: Not on file  Occupational History  . Occupation: Therapist, sports: [employer]  Social Needs  . Financial resource strain: Not on file  . Food insecurity:    Worry: Not on file    Inability: Not on file  . Transportation needs:    Medical: Not on file    Non-medical: Not on file  Tobacco Use  . Smoking status: Never Smoker  . Smokeless tobacco: Never Used  Substance and Sexual Activity  . Alcohol use: Yes    Alcohol/week: 0.0 standard drinks    Comment: maybe once a month  . Drug use: No  . Sexual activity: Not on  file  Lifestyle  . Physical activity:    Days per week: Not on file    Minutes per session: Not on file  . Stress: Not on file  Relationships  . Social connections:    Talks on phone: Not on file    Gets together: Not on file    Attends religious service: Not on file    Active member of club or organization: Not on file    Attends meetings of clubs or organizations: Not on file    Relationship status: Not on file  . Intimate partner violence:    Fear of current or ex partner: Not on file    Emotionally abused: Not on file    Physically abused: Not on file    Forced sexual activity: Not on file  Other Topics Concern  . Not on file  Social History Narrative  . Not on file    Past Surgical History:  Procedure Laterality Date  . APPENDECTOMY  07/10/2005  . ESOPHAGOGASTRODUODENOSCOPY  01-15-08   per Dr. Jarold Motto, GERD only   . LASIK    . TONSILLECTOMY      Family History  Problem Relation Age of Onset  . Hyperlipidemia Unknown   . Hypertension Unknown   . Heart disease Father   . Liver disease Father        cirrhosis  . Colon cancer Neg Hx  Allergies  Allergen Reactions  . Ciprofloxacin     nausea  . Ultram [Tramadol Hcl] Nausea Only    Current Outpatient Medications on File Prior to Visit  Medication Sig Dispense Refill  . [facility] ALLERGY RELIEF 180 MG tablet TAKE 1 TABLET EVERY DAY 30 tablet 6  . escitalopram (LEXAPRO) 10 MG tablet TAKE 1 TABLET BY MOUTH EVERY DAY 90 tablet 1  . famotidine (PEPCID) 40 MG tablet Take 1 tablet (40 mg total) by mouth at bedtime. 90 tablet 3  . levothyroxine (SYNTHROID, LEVOTHROID) 100 MCG tablet TAKE 1 TABLET BY MOUTH EVERY DAY 90 tablet 1  . montelukast (SINGULAIR) 10 MG tablet TAKE 1 TABLET BY MOUTH EVERYDAY AT BEDTIME 90 tablet 3  . Multiple Vitamin (MULTIVITAMIN) capsule Take 1 capsule by mouth daily.      . pantoprazole (PROTONIX) 40 MG tablet Take 1 tablet (40 mg total) by mouth 2 (two) times daily. 180 tablet 3   No current  facility-administered medications on file prior to visit.     BP (!) 136/92   Temp 98.9 F (37.2 C)   Wt 171 lb (77.6 kg)   BMI 26.00 kg/m       Objective:   Physical Exam Vitals signs and nursing note reviewed.  Constitutional:      Appearance: Normal appearance.  HENT:     Right Ear: Tympanic membrane, ear canal and external ear normal. There is no impacted cerumen.     Left Ear: Tympanic membrane, ear canal and external ear normal.     Nose: Congestion present. No rhinorrhea.     Right Turbinates: Enlarged and swollen.     Left Turbinates: Enlarged and swollen.     Right Sinus: Maxillary sinus tenderness present. No frontal sinus tenderness.     Left Sinus: No maxillary sinus tenderness or frontal sinus tenderness.     Mouth/Throat:     Pharynx: Oropharyngeal exudate present. No posterior oropharyngeal erythema.  Eyes:     Extraocular Movements: Extraocular movements intact.     Conjunctiva/sclera: Conjunctivae normal.     Pupils: Pupils are equal, round, and reactive to light.  Cardiovascular:     Rate and Rhythm: Normal rate and regular rhythm.     Pulses: Normal pulses.     Heart sounds: Normal heart sounds.  Pulmonary:     Effort: Pulmonary effort is normal.     Breath sounds: Normal breath sounds.  Skin:    General: Skin is warm.  Neurological:     General: No focal deficit present.     Mental Status: He is alert.        Assessment & Plan:  1. Acute non-recurrent maxillary sinusitis - Will treat due to time frame of symptoms.  - doxycycline (VIBRAMYCIN) 100 MG capsule; Take 1 capsule (100 mg total) by mouth 2 (two) times daily.  Dispense: 14 capsule; Refill: 0 - Advised Flonase  - Follow up if no improvement in the next 2-3 weeks   Shirline Frees, NP

## 2018-09-20 ENCOUNTER — Ambulatory Visit: Payer: Self-pay

## 2018-09-20 ENCOUNTER — Ambulatory Visit (INDEPENDENT_AMBULATORY_CARE_PROVIDER_SITE_OTHER): Payer: BLUE CROSS/BLUE SHIELD

## 2018-09-20 ENCOUNTER — Encounter: Payer: Self-pay | Admitting: Internal Medicine

## 2018-09-20 ENCOUNTER — Ambulatory Visit (INDEPENDENT_AMBULATORY_CARE_PROVIDER_SITE_OTHER): Payer: BLUE CROSS/BLUE SHIELD | Admitting: Internal Medicine

## 2018-09-20 ENCOUNTER — Other Ambulatory Visit: Payer: Self-pay

## 2018-09-20 VITALS — BP 120/80 | HR 54 | Temp 98.5°F | Wt 169.6 lb

## 2018-09-20 DIAGNOSIS — R05 Cough: Secondary | ICD-10-CM | POA: Diagnosis not present

## 2018-09-20 DIAGNOSIS — J181 Lobar pneumonia, unspecified organism: Secondary | ICD-10-CM

## 2018-09-20 DIAGNOSIS — R0989 Other specified symptoms and signs involving the circulatory and respiratory systems: Secondary | ICD-10-CM

## 2018-09-20 DIAGNOSIS — J069 Acute upper respiratory infection, unspecified: Secondary | ICD-10-CM

## 2018-09-20 DIAGNOSIS — J189 Pneumonia, unspecified organism: Secondary | ICD-10-CM

## 2018-09-20 MED ORDER — AMOXICILLIN-POT CLAVULANATE 875-125 MG PO TABS: 1.0000 | ORAL_TABLET | Freq: Two times a day (BID) | ORAL | 0 refills | Status: AC

## 2018-09-20 NOTE — Patient Instructions (Signed)
-CXR today. Will notify you if abx are needed.  -I hope you feel better soon!  -May use mucinex twice a day, delsym twice a day as needed for cough and over the counter flu/sinues medication as needed for pain and congestion.  -Come back to see [place] if no improvement in 10-14 days.   Viral Respiratory Infection A viral respiratory infection is an illness that affects parts of the body that are used for breathing. These include the lungs, nose, and throat. It is caused by a germ called a virus. Some examples of this kind of infection are:  A cold.  The flu (influenza).  A respiratory syncytial virus (RSV) infection. A person who gets this illness may have the following symptoms:  A stuffy or runny nose.  Yellow or green fluid in the nose.  A cough.  Sneezing.  Tiredness (fatigue).  Achy muscles.  A sore throat.  Sweating or chills.  A fever.  A headache. Follow these instructions at home: Managing pain and congestion  Take over-the-counter and prescription medicines only as told by your doctor.  If you have a sore throat, gargle with salt water. Do this 3-4 times per day or as needed. To make a salt-water mixture, dissolve -1 tsp of salt in 1 cup of warm water. Make sure that all the salt dissolves.  Use nose drops made from salt water. This helps with stuffiness (congestion). It also helps soften the skin around your nose.  Drink enough fluid to keep your pee (urine) pale yellow. General instructions   Rest as much as possible.  Do not drink alcohol.  Do not use any products that have nicotine or tobacco, such as cigarettes and e-cigarettes. If you need help quitting, ask your doctor.  Keep all follow-up visits as told by your doctor. This is important. How is this prevented?   Get a flu shot every year. Ask your doctor when you should get your flu shot.  Do not let other people get your germs. If you are sick: ? Stay home from work or school. ?  Wash your hands with soap and water often. Wash your hands after you cough or sneeze. If soap and water are not available, use hand sanitizer.  Avoid contact with people who are sick during cold and flu season. This is in fall and winter. Get help if:  Your symptoms last for 10 days or longer.  Your symptoms get worse over time.  You have a fever.  You have very bad pain in your face or forehead.  Parts of your jaw or neck become very swollen. Get help right away if:  You feel pain or pressure in your chest.  You have shortness of breath.  You faint or feel like you will faint.  You keep throwing up (vomiting).  You feel confused. Summary  A viral respiratory infection is an illness that affects parts of the body that are used for breathing.  Examples of this illness include a cold, the flu, and respiratory syncytial virus (RSV) infection.  The infection can cause a runny nose, cough, sneezing, sore throat, and fever.  Follow what your doctor tells you about taking medicines, drinking lots of fluid, washing your hands, resting at home, and avoiding people who are sick. This information is not intended to replace advice given to you by your health care provider. Make sure you discuss any questions you have with your health care provider. Document Released: 06/08/2008 Document Revised: 08/06/2017 Document  Reviewed: 08/06/2017 Elsevier Interactive Patient Education  [employer].

## 2018-09-20 NOTE — Telephone Encounter (Signed)
Noted  

## 2018-09-20 NOTE — Progress Notes (Signed)
Established Patient Office Visit     CC/Reason for Visit: Flulike symptoms  HPI: Benjamin Owen is a 43 y.o. male who is coming in today for the above mentioned reasons.  He came back from a work conference last week in [place] with people from all over the [place].  3 days ago developed a severe headache with sore throat, sinus pain and congestion.  He states he had a temperature of around 99.9.  He denies body aches, that he is aware of no sick contacts.  Recent travel only to [facility]  He does not feel short of breath.   Past Medical/Surgical History: Past Medical History:  Diagnosis Date  . Allergy   . Anal fissure   . Anxiety   . Carpal tunnel syndrome   . Chronic headaches   . Fatigue   . GERD (gastroesophageal reflux disease)   . 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 APPENDECTOMY  07/10/2005  . ESOPHAGOGASTRODUODENOSCOPY  01-15-08   per Dr. Jarold Motto, GERD only   . LASIK    . TONSILLECTOMY      Social History:  reports that he has never smoked. He has never used smokeless tobacco. He reports current alcohol use. He reports that he does not use drugs.  Allergies: Allergies  Allergen Reactions  . Ciprofloxacin     nausea  . Ultram [Tramadol Hcl] Nausea Only    Family History:  Family History  Problem Relation Age of Onset  . Hyperlipidemia Unknown   . Hypertension Unknown   . Heart disease Father   . Liver disease Father        cirrhosis  . Colon cancer Neg Hx      Current Outpatient Medications:  .  amoxicillin-clavulanate (AUGMENTIN) 875-125 MG tablet, Take 1 tablet by mouth 2 (two) times daily for 7 days., Disp: 14 tablet, Rfl: 0 .  [facility] ALLERGY RELIEF 180 MG tablet, TAKE 1 TABLET EVERY DAY, Disp: 30 tablet, Rfl: 6 .  escitalopram (LEXAPRO) 10 MG tablet, TAKE 1 TABLET BY MOUTH EVERY DAY, Disp: 90 tablet,  Rfl: 1 .  famotidine (PEPCID) 40 MG tablet, Take 1 tablet (40 mg total) by mouth at bedtime., Disp: 90 tablet, Rfl: 3 .  levothyroxine (SYNTHROID, LEVOTHROID) 100 MCG tablet, TAKE 1 TABLET BY MOUTH EVERY DAY, Disp: 90 tablet, Rfl: 1 .  montelukast (SINGULAIR) 10 MG tablet, TAKE 1 TABLET BY MOUTH EVERYDAY AT BEDTIME, Disp: 90 tablet, Rfl: 3 .  Multiple Vitamin (MULTIVITAMIN) capsule, Take 1 capsule by mouth daily.  , Disp: , Rfl:  .  pantoprazole (PROTONIX) 40 MG tablet, Take 1 tablet (40 mg total) by mouth 2 (two) times daily., Disp: 180 tablet, Rfl: 3  Review of Systems:  Constitutional: Denies fever, chills, diaphoresis, appetite change. HEENT: Denies photophobia, eye pain, redness, hearing loss, ear pain,  mouth sores, trouble swallowing, neck pain, neck stiffness and tinnitus.   Respiratory: Denies SOB, DOE,  chest tightness,  and wheezing.   Cardiovascular: Denies chest pain, palpitations and leg swelling.  Gastrointestinal: Denies nausea, vomiting, abdominal pain, diarrhea, constipation, blood in stool and abdominal distention.  Genitourinary: Denies dysuria, urgency, frequency, hematuria, flank pain and difficulty urinating.  Endocrine: Denies: hot or cold intolerance, sweats, changes in hair or nails, polyuria, polydipsia. Musculoskeletal: Denies myalgias, back pain, joint swelling, arthralgias and gait problem.  Skin: Denies pallor,  rash and wound.  Neurological: Denies dizziness, seizures, syncope, weakness, light-headedness, numbness and headaches.  Hematological: Denies adenopathy. Easy bruising, personal or family bleeding history  Psychiatric/Behavioral: Denies suicidal ideation, mood changes, confusion, nervousness, sleep disturbance and agitation    Physical Exam: Vitals:   09/20/18 1450  BP: 120/80  Pulse: (!) 54  Temp: 98.5 F (36.9 C)  TempSrc: Oral  SpO2: 99%  Weight: 169 lb 9.6 oz (76.9 kg)    Body mass index is 25.79 kg/m.   Constitutional: NAD, calm,  comfortable Eyes: PERRL, lids and conjunctivae normal ENMT: Mucous membranes are moist. Posterior pharynx is erythematous but clear of any exudate or lesions. Normal dentition. Tympanic membrane is pearly white, no erythema or bulging. Neck: normal, supple, no masses, no thyromegaly Respiratory: He has prominent left upper lobe crackles, normal respiratory effort. No accessory muscle use.  Cardiovascular: Regular rate and rhythm, no murmurs / rubs / gallops. No extremity edema. 2+ pedal pulses. No carotid bruits.    Impression and Plan:  Scattered respiratory crackles of left lung  Community acquired pneumonia of left upper lobe of lung (HCC) -Given his left upper lobe crackles and flulike symptoms I believe pneumonia is a possibility.  I have sent him for chest x-ray, formal reading is not yet available but it does appear normal to me, nonetheless we will elect to proceed with antibiotic therapy with Augmentin for 7 days as CXR findings can lag behind clinical findings. -He will return to clinic if no improvement in 10 to 14 days.     Patient Instructions    -CXR today. Will notify you if abx are needed.  -I hope you feel better soon!  -May use mucinex twice a day, delsym twice a day as needed for cough and over the counter flu/sinues medication as needed for pain and congestion.  -Come back to see us if no improvement in 10-14 days.   Viral Respiratory Infection A viral respiratory infection is an illness that affects parts of the body that are used for breathing. These include the lungs, nose, and throat. It is caused by a germ called a virus. Some examples of this kind of infection are:  A cold.  The flu (influenza).  A respiratory syncytial virus (RSV) infection. A person who gets this illness may have the following symptoms:  A stuffy or runny nose.  Yellow or green fluid in the nose.  A cough.  Sneezing.  Tiredness (fatigue).  Achy muscles.  A sore throat.   Sweating or chills.  A fever.  A headache. Follow these instructions at home: Managing pain and congestion  Take over-the-counter and prescription medicines only as told by your doctor.  If you have a sore throat, gargle with salt water. Do this 3-4 times per day or as needed. To make a salt-water mixture, dissolve -1 tsp of salt in 1 cup of warm water. Make sure that all the salt dissolves.  Use nose drops made from salt water. This helps with stuffiness (congestion). It also helps soften the skin around your nose.  Drink enough fluid to keep your pee (urine) pale yellow. General instructions   Rest as much as possible.  Do not drink alcohol.  Do not use any products that have nicotine or tobacco, such as cigarettes and e-cigarettes. If you need help quitting, ask your doctor.  Keep all follow-up visits as told by your doctor. This is important. How is this prevented?   Get a flu shot every year. Ask your  doctor when you should get your flu shot.  Do not let other people get your germs. If you are sick: ? Stay home from work or school. ? Wash your hands with soap and water often. Wash your hands after you cough or sneeze. If soap and water are not available, use hand sanitizer.  Avoid contact with people who are sick during cold and flu season. This is in fall and winter. Get help if:  Your symptoms last for 10 days or longer.  Your symptoms get worse over time.  You have a fever.  You have very bad pain in your face or forehead.  Parts of your jaw or neck become very swollen. Get help right away if:  You feel pain or pressure in your chest.  You have shortness of breath.  You faint or feel like you will faint.  You keep throwing up (vomiting).  You feel confused. Summary  A viral respiratory infection is an illness that affects parts of the body that are used for breathing.  Examples of this illness include a cold, the flu, and respiratory syncytial  virus (RSV) infection.  The infection can cause a runny nose, cough, sneezing, sore throat, and fever.  Follow what your doctor tells you about taking medicines, drinking lots of fluid, washing your hands, resting at home, and avoiding people who are sick. This information is not intended to replace advice given to you by your health care provider. Make sure you discuss any questions you have with your health care provider. Document Released: 06/08/2008 Document Revised: 08/06/2017 Document Reviewed: 08/06/2017 Elsevier Interactive Patient Education  2019 Elsevier Inc.       Chaya Jan, MD [facility]

## 2018-09-20 NOTE — Telephone Encounter (Signed)
Patient called in with c/o "sinus pain, congestion." He says "the symptoms started yesterday, facial pain under my eyes, at my cheek bones and headache. I have the congestion, but I also have a cough with chest congestion and nothing is coming up. I had a fever yesterday 99.8, but it's down today. I traveled to [place], [place] last week for a conference." I advised of the High Risk for COVID-19 and patient does not meet the criteria. I asked about nasal discharge, he says "it's yellow." I asked about other symptoms, he says "cough, sore throat, headache." According to protocol, see PCP within 24 hours, no availability today with PCP, appointment scheduled for today at 1500 with Dr. Ardyth Harps, care advice given, patient verbalized understanding.   Reason for Disposition . [1] Fever returns after gone for over 24 hours AND [2] symptoms worse or not improved  Answer Assessment - Initial Assessment Questions 1. LOCATION: "Where does it hurt?"      Tender to cheek bones right below eyes 2. ONSET: "When did the sinus pain start?"  (e.g., hours, days)      Yesterday 3. SEVERITY: "How bad is the pain?"   (Scale 1-10; mild, moderate or severe)   - MILD (1-3): doesn't interfere with normal activities    - MODERATE (4-7): interferes with normal activities (e.g., work or school) or awakens from sleep   - SEVERE (8-10): excruciating pain and patient unable to do any normal activities        Yesterday headache 7; today 2 4. RECURRENT SYMPTOM: "Have you ever had sinus problems before?" If so, ask: "When was the last time?" and "What happened that time?"      Yes, probably 6 months ago, same symptoms, antibiotics after 2 weeks 5. NASAL CONGESTION: "Is the nose blocked?" If so, ask, "Can you open it or must you breathe through the mouth?"     No 6. NASAL DISCHARGE: "Do you have discharge from your nose?" If so ask, "What color?"     Yellow  7. FEVER: "Do you have a fever?" If so, ask: "What is it, how was it  measured, and when did it start?"      No fever today, highest fever 99. 8. OTHER SYMPTOMS: "Do you have any other symptoms?" (e.g., sore throat, cough, earache, difficulty breathing)     Cough, sore throat, headache 9. PREGNANCY: "Is there any chance you are pregnant?" "When was your last menstrual period?"     No  Protocols used: SINUS PAIN OR CONGESTION-A-AH

## 2018-10-24 ENCOUNTER — Other Ambulatory Visit: Payer: Self-pay

## 2018-10-28 ENCOUNTER — Other Ambulatory Visit: Payer: Self-pay

## 2018-10-28 ENCOUNTER — Ambulatory Visit (INDEPENDENT_AMBULATORY_CARE_PROVIDER_SITE_OTHER): Payer: BLUE CROSS/BLUE SHIELD | Admitting: Gastroenterology

## 2018-10-28 ENCOUNTER — Encounter: Payer: Self-pay | Admitting: Gastroenterology

## 2018-10-28 DIAGNOSIS — R1310 Dysphagia, unspecified: Secondary | ICD-10-CM | POA: Diagnosis not present

## 2018-10-28 DIAGNOSIS — K219 Gastro-esophageal reflux disease without esophagitis: Secondary | ICD-10-CM

## 2018-10-28 DIAGNOSIS — R1319 Other dysphagia: Secondary | ICD-10-CM

## 2018-10-28 MED ORDER — SUCRALFATE 1 G PO TABS: 1.0000 g | ORAL_TABLET | Freq: Three times a day (TID) | ORAL | 1 refills | Status: DC

## 2018-10-28 NOTE — Progress Notes (Signed)
This patient contacted our office requesting a physician telemedicine video consultation regarding clinical questions and/or test results.  If new patient, they were referred by self  Participants on the Zoom : myself and patient   The patient consented to phone consultation and was aware that a charge will be placed through their insurance.  I was in my office and the patient was at home   Encounter time:  Total time 45 minutes, with 30 minutes spent with patient on phone/webex    Amada Jupiter, MD   _____________________________________________________________________________________________             [facility] Consult Note:  History: Lovie Chol 10/28/2018  Referring physician: Nelwyn Salisbury, MD  Reason for consult/chief complaint: GERD    Subjective  HPI:  Jarold Motto 2012.  EGD 01/2008 nml.  Bx  - no EoE, upper inlet patch Rx Dexilant and sucralfate (he recalls it helped) Worsened pyrosis last few months, has had to escalate acid suppression med without much improvement.  Now AM PPI, PM pepcid  (BID PPI not much help)  Also anal fissure 21012/13  Pyrosis, throat pain, fullness ("stuck")  Chronic AM loose stool. Sometimes dark stool.  occ'l tylenol, rare NSAIDs Rare nausea, no vomiting, appetite decreased, weight down, which he attributes to Abx and respiratory infection Little EtOH use, 2 cups coffee Am throat/chest burning   08/30/18 saw Nafziger for sinusitis, Rx doxycycline x 14 days 09/20/18  Saw another provider .  CXR "b/l apical scarring", then Rx amoxicillin  Feels like his respiratory symptoms have finally resolved.  ROS:  Review of Systems   Past Medical History: Past Medical History:  Diagnosis Date  . Allergy   . Anal fissure   . Anxiety   . Carpal tunnel syndrome   . Chronic headaches   . Fatigue   . GERD (gastroesophageal reflux disease)   . 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 APPENDECTOMY  07/10/2005  . ESOPHAGOGASTRODUODENOSCOPY  01-15-08   per Dr. Jarold Motto, GERD only   . LASIK    . TONSILLECTOMY       Family History: Family History  Problem Relation Age of Onset  . Heart disease Father   . Liver disease Father        cirrhosis  . Hyperlipidemia Other   . Hypertension Other   . Colon cancer Neg Hx   . Stomach cancer Neg Hx   . Colon polyps Neg Hx   . Pancreatic cancer Neg Hx   . Esophageal cancer Neg Hx     Social History: Social History   Socioeconomic History  . Marital status: Married    Spouse name: Not on file  . Number of children: 2  . Years of education: Not on file  . Highest education level: Not on file  Occupational History  . Occupation: Therapist, sports: [employer]  Social Needs  . Financial resource strain: Not on file  . Food insecurity:    Worry: Not on file    Inability: Not on file  . Transportation needs:    Medical: Not on file    Non-medical: Not on file  Tobacco Use  . Smoking status: Never Smoker  . Smokeless tobacco: Never Used  Substance and Sexual Activity  . Alcohol use: Yes    Alcohol/week: 0.0 standard drinks  Comment: maybe once a month  . Drug use: No  . Sexual activity: Yes    Partners: Female  Lifestyle  . Physical activity:    Days per week: Not on file    Minutes per session: Not on file  . Stress: Not on file  Relationships  . Social connections:    Talks on phone: Not on file    Gets together: Not on file    Attends religious service: Not on file    Active member of club or organization: Not on file    Attends meetings of clubs or organizations: Not on file    Relationship status: Not on file  Other Topics Concern  . Not on file  Social History Narrative  . 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  Allergies: Allergies  Allergen Reactions  . Ciprofloxacin     nausea  . Ultram [Tramadol Hcl] Nausea Only    Outpatient Meds: Current Outpatient Medications  Medication Sig Dispense Refill  . [facility]S ALLERGY RELIEF 180 MG tablet TAKE 1 TABLET EVERY DAY 30 tablet 6  . escitalopram (LEXAPRO) 10 MG tablet TAKE 1 TABLET BY MOUTH EVERY DAY 90 tablet 1  . famotidine (PEPCID) 40 MG tablet Take 1 tablet (40 mg total) by mouth at bedtime. 90 tablet 3  . levothyroxine (SYNTHROID, LEVOTHROID) 100 MCG tablet TAKE 1 TABLET BY MOUTH EVERY DAY 90 tablet 1  . montelukast (SINGULAIR) 10 MG tablet TAKE 1 TABLET BY MOUTH EVERYDAY AT BEDTIME 90 tablet 3  . Multiple Vitamin (MULTIVITAMIN) capsule Take 1 capsule by mouth daily.      . pantoprazole (PROTONIX) 40 MG tablet Take 1 tablet (40 mg total) by mouth 2 (two) times daily. (Patient taking differently: Take 40 mg by mouth daily. ) 180 tablet 3   No current facility-administered medications for this visit.       ___________________________________________________________________  No exam - virtual visit   Assessment: Encounter Diagnoses  Name Primary?  . Gastroesophageal reflux disease without esophagitis Yes  . Esophageal dysphagia     Longstanding GERD symptoms requiring chronic PPI, now persisting despite high-dose PPI.  He has a chest fullness or perhaps dysphagia that is somewhat difficult to characterize.  Probably reflux related dysmotility, less likely mechanical such as stricture based on the description.   Plan:  He recalls that sucralfate was helpful in the past, so I will prescribe that. Continue once daily pantoprazole but discontinued famotidine.  I would like him to have an upper endoscopy, tentatively in about 6 weeks based on current pandemic related restrictions.  My office will call him to make those plans.  Thank you for the courtesy of this consult.  Please  call me with any questions or concerns.  Charlie PitterHenry L Danis III  CC: Referring provider noted above

## 2018-11-19 ENCOUNTER — Other Ambulatory Visit: Payer: Self-pay | Admitting: Gastroenterology

## 2018-11-20 ENCOUNTER — Other Ambulatory Visit: Payer: Self-pay | Admitting: Family Medicine

## 2018-11-21 NOTE — Telephone Encounter (Signed)
Dr. Fry please advise on refill. Thanks  

## 2018-11-25 ENCOUNTER — Telehealth: Payer: Self-pay | Admitting: Gastroenterology

## 2018-11-25 NOTE — Telephone Encounter (Signed)
This patient was seen in telemedicine several weeks ago for GERD and dysphagia.  EGD was put off due to COVID.  Please contact him and ask if he feels ready to schedule an EGD in the [facility].

## 2018-11-26 ENCOUNTER — Other Ambulatory Visit: Payer: Self-pay

## 2018-11-26 MED ORDER — SUCRALFATE 1 G PO TABS: 1.0000 g | ORAL_TABLET | Freq: Three times a day (TID) | ORAL | 1 refills | Status: DC

## 2018-11-26 NOTE — Telephone Encounter (Signed)
Ive already scheduled his for 12-10-18 and he has a pre-visit on 11-27-18

## 2018-11-27 ENCOUNTER — Ambulatory Visit: Payer: BLUE CROSS/BLUE SHIELD | Admitting: *Deleted

## 2018-11-27 ENCOUNTER — Encounter: Payer: Self-pay | Admitting: Gastroenterology

## 2018-11-27 ENCOUNTER — Other Ambulatory Visit: Payer: Self-pay

## 2018-11-27 VITALS — Ht 69.0 in | Wt 165.0 lb

## 2018-11-27 DIAGNOSIS — R1319 Other dysphagia: Secondary | ICD-10-CM

## 2018-11-27 DIAGNOSIS — R1310 Dysphagia, unspecified: Secondary | ICD-10-CM

## 2018-11-27 DIAGNOSIS — K219 Gastro-esophageal reflux disease without esophagitis: Secondary | ICD-10-CM

## 2018-11-27 NOTE — Progress Notes (Signed)
Patient's pre-visit was done today with the patient over the phone. Insurance verified. Patient states no changes in medical hx since OV w/GI.  Patient denies any allergies to eggs or soy. Patient denies any problems with anesthesia/sedation. Patient denies any oxygen use at home. Patient denies taking any diet/weight loss medications or blood thinners. EMMI education assisgned to patient on EGD, this was explained and instructions given to patient. EGD prep instructions mailed to patient today-pt is aware.

## 2018-12-04 ENCOUNTER — Other Ambulatory Visit: Payer: Self-pay | Admitting: Gastroenterology

## 2018-12-09 ENCOUNTER — Telehealth: Payer: Self-pay

## 2018-12-09 NOTE — Telephone Encounter (Signed)
Covid-19 screening questions  Have you traveled in the last 14 days? No. If yes where?  Do you now or have you had a fever in the last 14 days? No.  Do you have any respiratory symptoms of shortness of breath or cough now or in the last 14 days? No.  Do you have any family members or close contacts with diagnosed or suspected Covid-19 in the past 14 days? No.  Have you been tested for Covid-19 and found to be positive? No.       

## 2018-12-11 ENCOUNTER — Encounter: Payer: Self-pay | Admitting: Gastroenterology

## 2018-12-11 ENCOUNTER — Other Ambulatory Visit: Payer: Self-pay

## 2018-12-11 ENCOUNTER — Encounter: Payer: BLUE CROSS/BLUE SHIELD | Admitting: Gastroenterology

## 2018-12-11 ENCOUNTER — Ambulatory Visit (AMBULATORY_SURGERY_CENTER): Payer: BLUE CROSS/BLUE SHIELD | Admitting: Gastroenterology

## 2018-12-11 VITALS — BP 104/66 | HR 65 | Temp 98.8°F | Resp 11 | Ht 69.0 in | Wt 169.0 lb

## 2018-12-11 DIAGNOSIS — K219 Gastro-esophageal reflux disease without esophagitis: Secondary | ICD-10-CM | POA: Diagnosis not present

## 2018-12-11 DIAGNOSIS — R1310 Dysphagia, unspecified: Secondary | ICD-10-CM | POA: Diagnosis not present

## 2018-12-11 DIAGNOSIS — K317 Polyp of stomach and duodenum: Secondary | ICD-10-CM | POA: Diagnosis not present

## 2018-12-11 DIAGNOSIS — R1319 Other dysphagia: Secondary | ICD-10-CM

## 2018-12-11 MED ORDER — SODIUM CHLORIDE 0.9 % IV SOLN: 500.0000 mL | INTRAVENOUS | Status: DC

## 2018-12-11 NOTE — Progress Notes (Signed)
Reviewed history with patient.

## 2018-12-11 NOTE — Op Note (Signed)
[facility] Patient Name: Benjamin Owen Procedure Date: 12/11/2018 8:36 AM MRN: [id number] Endoscopist: Sherilyn CooterHenry L. Myrtie Neitheranis , MD Age: [age] Referring MD:  Date of Birth: 09/25/1975 Gender: Male Account #: [id number] Procedure:                Upper GI endoscopy Indications:              Dysphagia, Esophageal reflux symptoms that persist                            despite appropriate therapy (twice daily PPI and                            recent addition of carafate) Medicines:                Monitored Anesthesia Care Procedure:                Pre-Anesthesia Assessment:                           - Prior to the procedure, a History and Physical                            was performed, and patient medications and                            allergies were reviewed. The patient's tolerance of                            previous anesthesia was also reviewed. The risks                            and benefits of the procedure and the sedation                            options and risks were discussed with the patient.                            All questions were answered, and informed consent                            was obtained. Prior Anticoagulants: The patient has                            taken no previous anticoagulant or antiplatelet                            agents. ASA Grade Assessment: II - A patient with                            mild systemic disease. After reviewing the risks                            and benefits, the patient was deemed in  satisfactory condition to undergo the procedure.                           After obtaining informed consent, the endoscope was                            passed under direct vision. Throughout the                            procedure, the patient's blood pressure, pulse, and                            oxygen saturations were monitored continuously. The                            Endoscope was introduced  through the mouth, and                            advanced to the second part of duodenum. The upper                            GI endoscopy was accomplished without difficulty.                            The patient tolerated the procedure well. Scope In: Scope Out: Findings:                 The larynx was normal.                           Inlet patch in proximal esophagus (also seen on                            prior EGD). Esophagus was otherwise normal.                           A few sessile fundic gland polyps with no stigmata                            of recent bleeding were found in the gastric body.                           The exam of the stomach was otherwise normal.                           The examined duodenum was normal. Complications:            No immediate complications. Estimated Blood Loss:     Estimated blood loss: none. Impression:               - Normal larynx.                           - A few fundic gland polyps.                           -  Normal examined duodenum.                           - No specimens collected. Recommendation:           - Patient has a contact number available for                            emergencies. The signs and symptoms of potential                            delayed complications were discussed with the                            patient. Return to normal activities tomorrow.                            Written discharge instructions were provided to the                            patient.                           - Resume previous diet.                           - Continue present medications. However, stop                            carafate if not improving symptoms.                           - Consider addition of QHS buspirone or                            amitriptyline. Benjamin Owen L. Myrtie Neither, MD 12/11/2018 8:57:00 AM This report has been signed electronically.

## 2018-12-11 NOTE — Patient Instructions (Signed)
YOU HAD AN ENDOSCOPIC PROCEDURE TODAY AT THE [facility]:   Refer to the procedure report that was given to you for any specific questions about what was found during the examination.  If the procedure report does not answer your questions, please call your gastroenterologist to clarify.  If you requested that your care partner not be given the details of your procedure findings, then the procedure report has been included in a sealed envelope for you to review at your convenience later.  YOU SHOULD EXPECT: Some feelings of bloating in the abdomen. Passage of more gas than usual.  Walking can help get rid of the air that was put into your GI tract during the procedure and reduce the bloating. If you had a lower endoscopy (such as a colonoscopy or flexible sigmoidoscopy) you may notice spotting of blood in your stool or on the toilet paper. If you underwent a bowel prep for your procedure, you may not have a normal bowel movement for a few days.  Please Note:  You might notice some irritation and congestion in your nose or some drainage.  This is from the oxygen used during your procedure.  There is no need for concern and it should clear up in a day or so.  SYMPTOMS TO REPORT IMMEDIATELY:   Following upper endoscopy (EGD)  Vomiting of blood or coffee ground material  New chest pain or pain under the shoulder blades  Painful or persistently difficult swallowing  New shortness of breath  Fever of 100F or higher  Black, tarry-looking stools  For urgent or emergent issues, a gastroenterologist can be reached at any hour by calling [phone].   DIET:  We do recommend a small meal at first, but then you may proceed to your regular diet.  Drink plenty of fluids but you should avoid alcoholic beverages for 24 hours.  ACTIVITY:  You should plan to take it easy for the rest of today and you should NOT DRIVE or use heavy machinery until tomorrow (because of the sedation medicines used  during the test).    FOLLOW UP: Our staff will call the number listed on your records 48-72 hours following your procedure to check on you and address any questions or concerns that you may have regarding the information given to you following your procedure. If we do not reach you, we will leave a message.  We will attempt to reach you two times.  During this call, we will ask if you have developed any symptoms of COVID 19. If you develop any symptoms (ie: fever, flu-like symptoms, shortness of breath, cough etc.) before then, please call [phone].  If you test positive for Covid 19 in the 2 weeks post procedure, please call and report this information to us.    If any biopsies were taken you will be contacted by phone or by letter within the next 1-3 weeks.  Please call us at [phone] if you have not heard about the biopsies in 3 weeks.    SIGNATURES/CONFIDENTIALITY: You and/or your care partner have signed paperwork which will be entered into your electronic medical record.  These signatures attest to the fact that that the information above on your After Visit Summary has been reviewed and is understood.  Full responsibility of the confidentiality of this discharge information lies with you and/or your care-partner. 

## 2018-12-11 NOTE — Progress Notes (Signed)
Pt experienced decrease BP with nausea with IV placement, Zofran given, placed in supine position, Dr Charlynne Cousins aware.

## 2018-12-11 NOTE — Progress Notes (Signed)
Report given to PACU, vss 

## 2018-12-13 ENCOUNTER — Telehealth: Payer: Self-pay

## 2018-12-13 NOTE — Telephone Encounter (Signed)
  Follow up Call-  Call back number 12/11/2018  Post procedure Call Back phone  # [phone]  Permission to leave phone message Yes  Some recent data might be hidden     Patient questions:  Do you have a fever, pain , or abdominal swelling? No. Pain Score  0 *  Have you tolerated food without any problems? Yes.    Have you been able to return to your normal activities? Yes.    Do you have any questions about your discharge instructions: Diet   No. Medications  No. Follow up visit  No.  Do you have questions or concerns about your Care? No.  Actions: * If pain score is 4 or above: No action needed, pain <4. 1. Have you developed a fever since your procedure? no  2.   Have you had an respiratory symptoms (SOB or cough) since your procedure? no  3.   Have you tested positive for COVID 19 since your procedure no  4.   Have you had any family members/close contacts diagnosed with the COVID 19 since your procedure?  no   If yes to any of these questions please route to Laverna Peace, RN and Jennye Boroughs, Charity fundraiser.

## 2018-12-13 NOTE — Telephone Encounter (Signed)
Left message on answering machine. 

## 2018-12-25 ENCOUNTER — Other Ambulatory Visit: Payer: Self-pay | Admitting: Family Medicine

## 2018-12-27 ENCOUNTER — Telehealth: Payer: Self-pay | Admitting: Gastroenterology

## 2018-12-27 NOTE — Telephone Encounter (Signed)
Please see my 6/4 portal message to patient after his EGD. Does not appear he read the message (listed as beng MyChart active)  I would appreciate you passing this message to him verbally.  - HD

## 2018-12-27 NOTE — Telephone Encounter (Signed)
Relayed message to the patient.  He is open to any rx that you recommend.

## 2019-01-01 MED ORDER — BUSPIRONE HCL 15 MG PO TABS: ORAL_TABLET | ORAL | 0 refills | Status: DC

## 2019-01-01 NOTE — Telephone Encounter (Signed)
Pt aware and willing to try med. Script sent to pharmacy. Pt scheduled for telehealth visit with Dr. Loletha Carrow 01/28/19@4pm .

## 2019-01-01 NOTE — Telephone Encounter (Signed)
After his recent upper endoscopy, this patient and I discussed adding additional medicine to help with his heartburn and throat discomfort.  I recommend starting with the buspirone 15 mg tablet, take half tablet at bedtime for a week.  If no side effect such as disturbed sleep or morning somnolence, increase to a full tablet at bedtime.  Medicine can cause fatigue and somnolence, so best not to take it during the day.  If he is agreeable to that, please send a prescription for buspirone 15 mg tablet with these instructions.  Dispense 30 tablets, 0 refill.  Telemedicine with me for follow-up in 3 to 4 weeks.

## 2019-01-01 NOTE — Addendum Note (Signed)
Addended by: Rosanne Sack R on: 01/01/2019 01:27 PM   Modules accepted: Orders

## 2019-01-28 ENCOUNTER — Ambulatory Visit (INDEPENDENT_AMBULATORY_CARE_PROVIDER_SITE_OTHER): Payer: BC Managed Care – PPO | Admitting: Gastroenterology

## 2019-01-28 ENCOUNTER — Other Ambulatory Visit: Payer: Self-pay | Admitting: Family Medicine

## 2019-01-28 ENCOUNTER — Encounter: Payer: Self-pay | Admitting: Gastroenterology

## 2019-01-28 VITALS — Ht 68.5 in | Wt 162.0 lb

## 2019-01-28 DIAGNOSIS — K219 Gastro-esophageal reflux disease without esophagitis: Secondary | ICD-10-CM | POA: Diagnosis not present

## 2019-01-28 NOTE — Progress Notes (Signed)
This patient contacted our office requesting a physician telemedicine consultation regarding clinical questions and/or test results. Due to COVID restrictions, this was felt to be the most appropriate method of patient evaluation.  Participants on the conference : myself and patient   The patient consented to this consultation and was aware that a charge will be placed through their insurance.  They were also made aware of the limitations of telemedicine.  I was in my office and the patient was at home.   Encounter time:  Total time 20 minutes, with 15 minutes spent with patient on doximity   _____________________________________________________________________________________________               Benjamin Owen GI Progress Note  Chief Complaint: Heartburn  Subjective  History: Telemedicine in April for heartburn and throat discomfort incompletely responsive to high-dose PPI, and did not improve much on the addition of Carafate.  He had had similar symptoms years before when seen Dr. Sharlett Iles.  Upper endoscopy about 6 weeks ago with fundic gland polyps, gastric inlet patch, otherwise normal exam.  I suspected an element of functional heartburn, and last month suggested a trial of low-dose buspirone at bed time.  Benjamin Owen picked up the buspirone, but had not yet taken it because he was feeling better.  He stopped the Carafate, and is now on twice daily Protonix.  He has significantly decreased heartburn, no dysphagia or odynophagia, normal appetite and no weight loss.  ROS: Cardiovascular:  no chest pain Respiratory: no dyspnea  The patient's Past Medical, Family and Social History were reviewed and are on file in the EMR.  Objective:  Med list reviewed  Current Outpatient Medications:  .  [facility] ALLERGY RELIEF 180 MG tablet, TAKE 1 TABLET EVERY DAY, Disp: 30 tablet, Rfl: 6 .  escitalopram (LEXAPRO) 10 MG tablet, TAKE 1 TABLET BY MOUTH EVERY DAY, Disp: 90 tablet, Rfl: 3 .   levothyroxine (SYNTHROID) 100 MCG tablet, TAKE 1 TABLET BY MOUTH EVERY DAY, Disp: 30 tablet, Rfl: 0 .  montelukast (SINGULAIR) 10 MG tablet, TAKE 1 TABLET BY MOUTH EVERYDAY AT BEDTIME, Disp: 90 tablet, Rfl: 3 .  Multiple Vitamin (MULTIVITAMIN) capsule, Take 1 capsule by mouth daily.  , Disp: , Rfl:  .  pantoprazole (PROTONIX) 40 MG tablet, Take 1 tablet (40 mg total) by mouth 2 (two) times daily. (Patient taking differently: Take 40 mg by mouth daily. ), Disp: 180 tablet, Rfl: 3 .  busPIRone (BUSPAR) 15 MG tablet, Take 1/2 tab by mouth at night for 7 days. If tolerated increase to 1 tablet at bedtime (Patient not taking: Reported on 01/28/2019), Disp: 30 tablet, Rfl: 0    No exam - virtual visit   @ASSESSMENTPLANBEGIN @ Assessment: Encounter Diagnosis  Name Primary?  . Gastroesophageal reflux disease without esophagitis Yes   He is feeling better lately.  I suspect he has underlying GERD but also an overlay of functional heartburn.  He wanted some advice on long-term management of the acid suppression. Plan: Decrease Protonix to once daily.  If he has flareup of heartburn and throat discomfort as before, try the BuSpar at bedtime as directed.  Start with one half tablet at bedtime for a week.  If tolerated, increase to 1 full tablet at bedtime.  I asked him to contact us through clinical nursing or portal message in several weeks with an update.   Nelida Meuse III

## 2019-02-01 ENCOUNTER — Other Ambulatory Visit: Payer: Self-pay | Admitting: Gastroenterology

## 2019-04-02 ENCOUNTER — Other Ambulatory Visit: Payer: Self-pay

## 2019-04-02 ENCOUNTER — Telehealth (INDEPENDENT_AMBULATORY_CARE_PROVIDER_SITE_OTHER): Payer: BC Managed Care – PPO | Admitting: Family Medicine

## 2019-04-02 ENCOUNTER — Encounter: Payer: Self-pay | Admitting: Family Medicine

## 2019-04-02 DIAGNOSIS — J019 Acute sinusitis, unspecified: Secondary | ICD-10-CM | POA: Diagnosis not present

## 2019-04-02 MED ORDER — AZITHROMYCIN 250 MG PO TABS: ORAL_TABLET | ORAL | 0 refills | Status: DC

## 2019-04-02 NOTE — Progress Notes (Signed)
Virtual Visit via Video Note  I connected with the patient on 04/02/19 at  2:00 PM EDT by a video enabled telemedicine application and verified that I am speaking with the correct person using two identifiers.  Location patient: home Location provider:work or home office Persons participating in the virtual visit: patient, provider  I discussed the limitations of evaluation and management by telemedicine and the availability of in person appointments. The patient expressed understanding and agreed to proceed.   HPI: Here for 3 weeks of sinus congestion, blowing yellow mucus from the nose, PND, and a headache. No fever or cough or SOB. Using Flonase, Sudafed, and Mucinex.   ROS: See pertinent positives and negatives per HPI.  Past Medical History:  Diagnosis Date  . Allergy   . Anal fissure   . Anxiety   . Carpal tunnel syndrome   . Chronic headaches   . Fatigue   . GERD (gastroesophageal reflux disease)   . 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 APPENDECTOMY  07/10/2005  . ESOPHAGOGASTRODUODENOSCOPY  01-15-08   per Dr. Sharlett Iles, GERD only   . LASIK    . TONSILLECTOMY      Family History  Problem Relation Age of Onset  . Heart disease Father   . Liver disease Father        cirrhosis  . Hyperlipidemia Other   . Hypertension Other   . Colon cancer Neg Hx   . Stomach cancer Neg Hx   . Colon polyps Neg Hx   . Pancreatic cancer Neg Hx   . Esophageal cancer Neg Hx   . Rectal cancer Neg Hx      Current Outpatient Medications:  .  azithromycin (ZITHROMAX Z-PAK) 250 MG tablet, As directed, Disp: 6 each, Rfl: 0 .  busPIRone (BUSPAR) 15 MG tablet, Take 1/2 tab by mouth at night for 7 days. If tolerated increase to 1 tablet at bedtime (Patient not taking: Reported on 01/28/2019), Disp: 30 tablet, Rfl: 0 .  [facility] ALLERGY RELIEF 180 MG tablet, TAKE 1 TABLET EVERY  DAY, Disp: 30 tablet, Rfl: 6 .  escitalopram (LEXAPRO) 10 MG tablet, TAKE 1 TABLET BY MOUTH EVERY DAY, Disp: 90 tablet, Rfl: 3 .  levothyroxine (SYNTHROID) 100 MCG tablet, TAKE 1 TABLET BY MOUTH EVERY DAY, Disp: 30 tablet, Rfl: 2 .  montelukast (SINGULAIR) 10 MG tablet, TAKE 1 TABLET BY MOUTH EVERYDAY AT BEDTIME, Disp: 90 tablet, Rfl: 3 .  Multiple Vitamin (MULTIVITAMIN) capsule, Take 1 capsule by mouth daily.  , Disp: , Rfl:  .  pantoprazole (PROTONIX) 40 MG tablet, Take 1 tablet (40 mg total) by mouth 2 (two) times daily. (Patient taking differently: Take 40 mg by mouth daily. ), Disp: 180 tablet, Rfl: 3  EXAM:  VITALS per patient if applicable:  GENERAL: alert, oriented, appears well and in no acute distress  HEENT: atraumatic, conjunttiva clear, no obvious abnormalities on inspection of external nose and ears  NECK: normal movements of the head and neck  LUNGS: on inspection no signs of respiratory distress, breathing rate appears normal, no obvious gross SOB, gasping or wheezing  CV: no obvious cyanosis  MS: moves all visible extremities without noticeable abnormality  PSYCH/NEURO: pleasant and cooperative, no obvious depression or anxiety, speech and thought processing grossly intact  ASSESSMENT AND PLAN: Sinusitis, treat with a Zpack.  Alysia Penna, MD  Discussed the following assessment  and plan:  No diagnosis found.     I discussed the assessment and treatment plan with the patient. The patient was provided an opportunity to ask questions and all were answered. The patient agreed with the plan and demonstrated an understanding of the instructions.   The patient was advised to call back or seek an in-person evaluation if the symptoms worsen or if the condition fails to improve as anticipated.

## 2019-04-27 ENCOUNTER — Other Ambulatory Visit: Payer: Self-pay | Admitting: Family Medicine

## 2019-05-09 DIAGNOSIS — R519 Headache, unspecified: Secondary | ICD-10-CM | POA: Diagnosis not present

## 2019-05-09 DIAGNOSIS — R5381 Other malaise: Secondary | ICD-10-CM | POA: Diagnosis not present

## 2019-05-09 DIAGNOSIS — Z20828 Contact with and (suspected) exposure to other viral communicable diseases: Secondary | ICD-10-CM | POA: Diagnosis not present

## 2019-07-27 ENCOUNTER — Other Ambulatory Visit: Payer: Self-pay | Admitting: Family Medicine

## 2019-07-30 ENCOUNTER — Other Ambulatory Visit: Payer: Self-pay | Admitting: Family Medicine

## 2019-08-14 ENCOUNTER — Other Ambulatory Visit: Payer: Self-pay | Admitting: Family Medicine

## 2019-09-29 ENCOUNTER — Other Ambulatory Visit: Payer: Self-pay | Admitting: Family Medicine

## 2019-10-03 ENCOUNTER — Other Ambulatory Visit: Payer: Self-pay | Admitting: Family Medicine

## 2019-10-11 DIAGNOSIS — R197 Diarrhea, unspecified: Secondary | ICD-10-CM | POA: Diagnosis not present

## 2019-10-11 DIAGNOSIS — R110 Nausea: Secondary | ICD-10-CM | POA: Diagnosis not present

## 2019-10-13 ENCOUNTER — Telehealth: Payer: Self-pay | Admitting: Family Medicine

## 2019-10-13 NOTE — Telephone Encounter (Signed)
Pt went to the Urgent Care over the weekend due to diarrhea/headache for 9 days and was told that he has a bacterial infection. They prescribed him Ciprofloxacin and he has started taking it Saturday night. They were not able to take stool sample to test which bacterial infection and suggested he follow up with his PCP to see what he would like to do?    Pt can be reached at [phone]

## 2019-10-14 ENCOUNTER — Other Ambulatory Visit: Payer: Self-pay

## 2019-10-14 NOTE — Telephone Encounter (Signed)
Appt scheduled

## 2019-10-14 NOTE — Telephone Encounter (Signed)
Nothing further needed 

## 2019-10-14 NOTE — Telephone Encounter (Signed)
Make him an OV with me

## 2019-10-15 ENCOUNTER — Ambulatory Visit: Payer: BC Managed Care – PPO | Admitting: Family Medicine

## 2019-10-15 ENCOUNTER — Encounter: Payer: Self-pay | Admitting: Family Medicine

## 2019-10-15 ENCOUNTER — Ambulatory Visit (INDEPENDENT_AMBULATORY_CARE_PROVIDER_SITE_OTHER): Payer: BC Managed Care – PPO | Admitting: Family Medicine

## 2019-10-15 VITALS — BP 120/70 | HR 88 | Temp 98.2°F | Wt 165.2 lb

## 2019-10-15 DIAGNOSIS — K529 Noninfective gastroenteritis and colitis, unspecified: Secondary | ICD-10-CM

## 2019-10-15 NOTE — Progress Notes (Signed)
   Subjective:    Patient ID: Benjamin Owen, male    DOB: 07/19/1975, 44 y.o.   MRN: [id number]  HPI Here to check on symptoms he has had for a week. He had both Covid vaccines, one on 09-12-19 and the other on 10-03-19. The day after the second vaccine he developed a mild headache, diarrhea, and nausea without vomiting. No fever. He has had mild abdominal cramps but no real pain. No ST or cough or SOB. He has been drinking water and Pedialyte. On 10-11-19 he went to a walk in medical clinic and he was diagnosed with a bacterial bowel infection. His urine was clear that day, and no other testing was done. He denies any recent travel, and no one else in his family has been sick. He was started on 7 days of Cipro, and he has felt better the past 2 days.    Review of Systems  Constitutional: Negative.   HENT: Negative.   Eyes: Negative.   Respiratory: Negative.   Cardiovascular: Negative.   Gastrointestinal: Positive for diarrhea and nausea. Negative for abdominal distention, abdominal pain, anal bleeding, blood in stool, constipation, rectal pain and vomiting.  Genitourinary: Negative.        Objective:   Physical Exam Constitutional:      Appearance: Normal appearance. He is not ill-appearing.  Eyes:     General: No scleral icterus.    Conjunctiva/sclera: Conjunctivae normal.  Cardiovascular:     Rate and Rhythm: Normal rate and regular rhythm.     Pulses: Normal pulses.     Heart sounds: Normal heart sounds.  Pulmonary:     Effort: Pulmonary effort is normal.     Breath sounds: Normal breath sounds.  Abdominal:     General: Abdomen is flat. Bowel sounds are normal. There is no distension.     Palpations: Abdomen is soft. There is no mass.     Tenderness: There is no abdominal tenderness. There is no guarding or rebound.     Hernia: No hernia is present.  Lymphadenopathy:     Cervical: No cervical adenopathy.  Neurological:     Mental Status: He is alert.             Assessment & Plan:  Enteritis, possibly bacterial. He seems to be getting over it. He will finish up the Cipro. Use Imodium prn. Recheck prn.  Gershon Crane, MD

## 2019-10-24 ENCOUNTER — Other Ambulatory Visit: Payer: Self-pay

## 2019-10-24 ENCOUNTER — Ambulatory Visit (INDEPENDENT_AMBULATORY_CARE_PROVIDER_SITE_OTHER): Payer: BC Managed Care – PPO | Admitting: Family Medicine

## 2019-10-24 ENCOUNTER — Encounter: Payer: Self-pay | Admitting: Family Medicine

## 2019-10-24 VITALS — BP 120/60 | HR 77 | Temp 98.1°F | Wt 164.2 lb

## 2019-10-24 DIAGNOSIS — K529 Noninfective gastroenteritis and colitis, unspecified: Secondary | ICD-10-CM

## 2019-10-24 LAB — CBC WITH DIFFERENTIAL/PLATELET
Basophils Absolute: 0 10*3/uL (ref 0.0–0.1)
Basophils Relative: 0.3 % (ref 0.0–3.0)
Eosinophils Absolute: 0.1 10*3/uL (ref 0.0–0.7)
Eosinophils Relative: 1.8 % (ref 0.0–5.0)
HCT: 42 % (ref 39.0–52.0)
Hemoglobin: 14.7 g/dL (ref 13.0–17.0)
Lymphocytes Relative: 35.6 % (ref 12.0–46.0)
Lymphs Abs: 1.8 10*3/uL (ref 0.7–4.0)
MCHC: 34.9 g/dL (ref 30.0–36.0)
MCV: 86.8 fl (ref 78.0–100.0)
Monocytes Absolute: 0.5 10*3/uL (ref 0.1–1.0)
Monocytes Relative: 9.3 % (ref 3.0–12.0)
Neutro Abs: 2.7 10*3/uL (ref 1.4–7.7)
Neutrophils Relative %: 53 % (ref 43.0–77.0)
Platelets: 241 10*3/uL (ref 150.0–400.0)
RBC: 4.84 Mil/uL (ref 4.22–5.81)
RDW: 13.2 % (ref 11.5–15.5)
WBC: 5 10*3/uL (ref 4.0–10.5)

## 2019-10-24 LAB — BASIC METABOLIC PANEL
BUN: 15 mg/dL (ref 6–23)
CO2: 32 mEq/L (ref 19–32)
Calcium: 9.5 mg/dL (ref 8.4–10.5)
Chloride: 102 mEq/L (ref 96–112)
Creatinine, Ser: 0.97 mg/dL (ref 0.40–1.50)
GFR: 84.63 mL/min (ref >60.00)
Glucose, Bld: 85 mg/dL (ref 70–99)
Potassium: 4.3 mEq/L (ref 3.5–5.1)
Sodium: 139 mEq/L (ref 135–145)

## 2019-10-24 LAB — HEPATIC FUNCTION PANEL
ALT: 40 U/L (ref 0–53)
AST: 21 U/L (ref 0–37)
Albumin: 4.6 g/dL (ref 3.5–5.2)
Alkaline Phosphatase: 59 U/L (ref 39–117)
Bilirubin, Direct: 0.1 mg/dL (ref 0.0–0.3)
Total Bilirubin: 0.4 mg/dL (ref 0.2–1.2)
Total Protein: 6.6 g/dL (ref 6.0–8.3)

## 2019-10-24 MED ORDER — METRONIDAZOLE 500 MG PO TABS: 500.0000 mg | ORAL_TABLET | Freq: Three times a day (TID) | ORAL | 0 refills | Status: DC

## 2019-10-24 MED ORDER — CIPROFLOXACIN HCL 500 MG PO TABS: 500.0000 mg | ORAL_TABLET | Freq: Two times a day (BID) | ORAL | 0 refills | Status: DC

## 2019-10-24 NOTE — Progress Notes (Signed)
   Subjective:    Patient ID: Benjamin Owen, male    DOB: [birth year], 44 y.o.   MRN: [id number]  HPI Here to follow up on some GI symptoms that started about 2 and 1/2 weeks ago. He has had nausea with occasional vomiting, mild abdominal cramps, and headache. No fevers. He saw an urgent care on 10-11-19 and was given a 7 day course of Cipro. He then saw me on 10-15-19 and he was feeling better. We decided he would finosh up the Cipro. He says he continued to feel better until earlier this week, when the symptoms came back. He is able to eat and drink. His family is fine. His stools are now somewhat formed, not liquid like they were at first. No jaundice.    Review of Systems  Constitutional: Negative.   Respiratory: Negative.   Cardiovascular: Negative.   Gastrointestinal: Positive for abdominal pain, diarrhea, nausea and vomiting. Negative for abdominal distention, anal bleeding, blood in stool and constipation.  Genitourinary: Negative.        Objective:   Physical Exam Constitutional:      Appearance: Normal appearance. He is not ill-appearing.  Cardiovascular:     Rate and Rhythm: Normal rate and regular rhythm.     Pulses: Normal pulses.     Heart sounds: Normal heart sounds.  Pulmonary:     Effort: Pulmonary effort is normal.     Breath sounds: Normal breath sounds.  Abdominal:     General: Abdomen is flat. Bowel sounds are normal. There is no distension.     Palpations: Abdomen is soft. There is no mass.     Tenderness: There is no abdominal tenderness. There is no guarding or rebound.     Hernia: No hernia is present.     Comments: No HSM   Skin:    Coloration: Skin is not jaundiced.     Findings: No erythema or rash.  Neurological:     Mental Status: He is alert.           Assessment & Plan:  Partially treated enteritis. We will get labs today including CBC, liver panel, and stool cultures. Treat with more Cipro and add Flagyl to this for 10 days.  Gershon Crane,  MD

## 2019-10-27 ENCOUNTER — Telehealth: Payer: Self-pay | Admitting: Family Medicine

## 2019-10-27 MED ORDER — ONDANSETRON HCL 8 MG PO TABS: 8.0000 mg | ORAL_TABLET | Freq: Three times a day (TID) | ORAL | 1 refills | Status: DC | PRN

## 2019-10-27 NOTE — Telephone Encounter (Signed)
No, there is no blood test for Crohns disease. This diagnosis is made with a colonoscopy. For the nausea, please call in Zofran 8 mg to take every 8 hours prn nausea, #30 with one refill

## 2019-10-27 NOTE — Telephone Encounter (Signed)
Rx sent in. Patient is aware of Dr. Claris Che message below.

## 2019-10-27 NOTE — Telephone Encounter (Signed)
Pt wanted to see if his PCP will send prescription for nausea? The antibiotics he is on is causing nausea.   Pt is also wondering when they took his blood work Friday if they are testing for chromes disease?  Ph[facility]acy: [facility] [address] FAX: [phone]   Pt can be reached at [phone]

## 2019-10-27 NOTE — Addendum Note (Signed)
Addended by: Solon Augusta on: 10/27/2019 01:07 PM   Modules accepted: Orders

## 2019-10-28 LAB — STOOL CULTURE
MICRO NUMBER:: 10373131
MICRO NUMBER:: 10373132
MICRO NUMBER:: 10373133
SHIGA RESULT:: NOT DETECTED
SPECIMEN QUALITY:: ADEQUATE
SPECIMEN QUALITY:: ADEQUATE
SPECIMEN QUALITY:: ADEQUATE

## 2019-10-28 LAB — TIQ-NTM

## 2019-10-31 ENCOUNTER — Telehealth: Payer: Self-pay | Admitting: Family Medicine

## 2019-10-31 NOTE — Telephone Encounter (Signed)
Pt would like a call to go over results from 4/16. Advised they were released to [employer] and he informed he does not know how to access results on mychart he prefers a phone call.

## 2019-10-31 NOTE — Telephone Encounter (Signed)
Discussed results with patient, patient expressed understanding. Nothing further needed. ° °

## 2019-11-11 ENCOUNTER — Other Ambulatory Visit: Payer: Self-pay | Admitting: Family Medicine

## 2019-12-25 ENCOUNTER — Other Ambulatory Visit: Payer: Self-pay | Admitting: Family Medicine

## 2020-01-18 ENCOUNTER — Other Ambulatory Visit: Payer: Self-pay | Admitting: Family Medicine

## 2020-01-19 ENCOUNTER — Other Ambulatory Visit: Payer: Self-pay | Admitting: Family Medicine

## 2020-01-20 ENCOUNTER — Telehealth (INDEPENDENT_AMBULATORY_CARE_PROVIDER_SITE_OTHER): Payer: BC Managed Care – PPO | Admitting: Family Medicine

## 2020-01-20 ENCOUNTER — Encounter: Payer: Self-pay | Admitting: Family Medicine

## 2020-01-20 DIAGNOSIS — K219 Gastro-esophageal reflux disease without esophagitis: Secondary | ICD-10-CM

## 2020-01-20 MED ORDER — PANTOPRAZOLE SODIUM 40 MG PO TBEC: 40.0000 mg | DELAYED_RELEASE_TABLET | Freq: Two times a day (BID) | ORAL | 3 refills | Status: DC

## 2020-01-20 NOTE — Progress Notes (Signed)
Subjective:    Patient ID: Benjamin Owen, male    DOB: [birth year], 44 y.o.   MRN: [id number]  HPI Virtual Visit via Video Note  I connected with the patient on 01/20/20 at  4:00 PM EDT by a video enabled telemedicine application and verified that I am speaking with the correct person using two identifiers.  Location patient: home Location provider:work or home office Persons participating in the virtual visit: patient, provider  I discussed the limitations of evaluation and management by telemedicine and the availability of in person appointments. The patient expressed understanding and agreed to proceed.   HPI: Here for refills on Protonix. This helps his GERD quite a bit.  In general he feels well.    ROS: See pertinent positives and negatives per HPI.  Past Medical History:  Diagnosis Date  . Allergy   . Anal fissure   . Anxiety   . Carpal tunnel syndrome   . Chronic headaches   . Fatigue   . GERD (gastroesophageal reflux disease)   . 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 APPENDECTOMY  07/10/2005  . ESOPHAGOGASTRODUODENOSCOPY  01-15-08   per Dr. Jarold Motto, GERD only   . LASIK    . TONSILLECTOMY      Family History  Problem Relation Age of Onset  . Heart disease Father   . Liver disease Father        cirrhosis  . Hyperlipidemia Other   . Hypertension Other   . Colon cancer Neg Hx   . Stomach cancer Neg Hx   . Colon polyps Neg Hx   . Pancreatic cancer Neg Hx   . Esophageal cancer Neg Hx   . Rectal cancer Neg Hx      Current Outpatient Medications:  .  busPIRone (BUSPAR) 15 MG tablet, Take 1/2 tab by mouth at night for 7 days. If tolerated increase to 1 tablet at bedtime, Disp: 30 tablet, Rfl: 0 .  ciprofloxacin (CIPRO) 500 MG tablet, Take 1 tablet (500 mg total) by mouth 2 (two) times daily., Disp: 20 tablet, Rfl: 0 .  [facility] ALLERGY  RELIEF 180 MG tablet, TAKE 1 TABLET EVERY DAY, Disp: 30 tablet, Rfl: 6 .  escitalopram (LEXAPRO) 10 MG tablet, TAKE 1 TABLET BY MOUTH EVERY DAY, Disp: 90 tablet, Rfl: 3 .  levothyroxine (SYNTHROID) 100 MCG tablet, TAKE 1 TABLET BY MOUTH EVERY DAY, Disp: 90 tablet, Rfl: 0 .  metroNIDAZOLE (FLAGYL) 500 MG tablet, Take 1 tablet (500 mg total) by mouth 3 (three) times daily., Disp: 21 tablet, Rfl: 0 .  montelukast (SINGULAIR) 10 MG tablet, TAKE 1 TABLET BY MOUTH EVERYDAY AT BEDTIME, Disp: 90 tablet, Rfl: 3 .  Multiple Vitamin (MULTIVITAMIN) capsule, Take 1 capsule by mouth daily.  , Disp: , Rfl:  .  ondansetron (ZOFRAN) 8 MG tablet, Take 1 tablet (8 mg total) by mouth every 8 (eight) hours as needed for nausea or vomiting., Disp: 30 tablet, Rfl: 1 .  pantoprazole (PROTONIX) 40 MG tablet, Take 1 tablet (40 mg total) by mouth 2 (two) times daily., Disp: 180 tablet, Rfl: 3  EXAM:  VITALS per patient if applicable:  GENERAL: alert, oriented, appears well and in no acute distress  HEENT: atraumatic, conjunttiva clear, no obvious abnormalities on inspection of external nose and ears  NECK: normal movements of the head and neck  LUNGS: on inspection no signs  of respiratory distress, breathing rate appears normal, no obvious gross SOB, gasping or wheezing  CV: no obvious cyanosis  MS: moves all visible extremities without noticeable abnormality  PSYCH/NEURO: pleasant and cooperative, no obvious depression or anxiety, speech and thought processing grossly intact  ASSESSMENT AND PLAN: GERD, stable. meds were refilled.  Gershon Crane, MD   Discussed the following assessment and plan:  No diagnosis found.     I discussed the assessment and treatment plan with the patient. The patient was provided an opportunity to ask questions and all were answered. The patient agreed with the plan and demonstrated an understanding of the instructions.   The patient was advised to call back or seek an  in-person evaluation if the symptoms worsen or if the condition fails to improve as anticipated.     Review of Systems     Objective:   Physical Exam        Assessment & Plan:

## 2020-04-07 IMAGING — DX CHEST - 2 VIEW
2 series · 2 of 2 positions shown · non-contrast
Comparison: None.

CLINICAL DATA: 42-year-old with cough. Scattered respiratory
crackles of left lung.

EXAM:
CHEST - 2 VIEW

[chest pa]
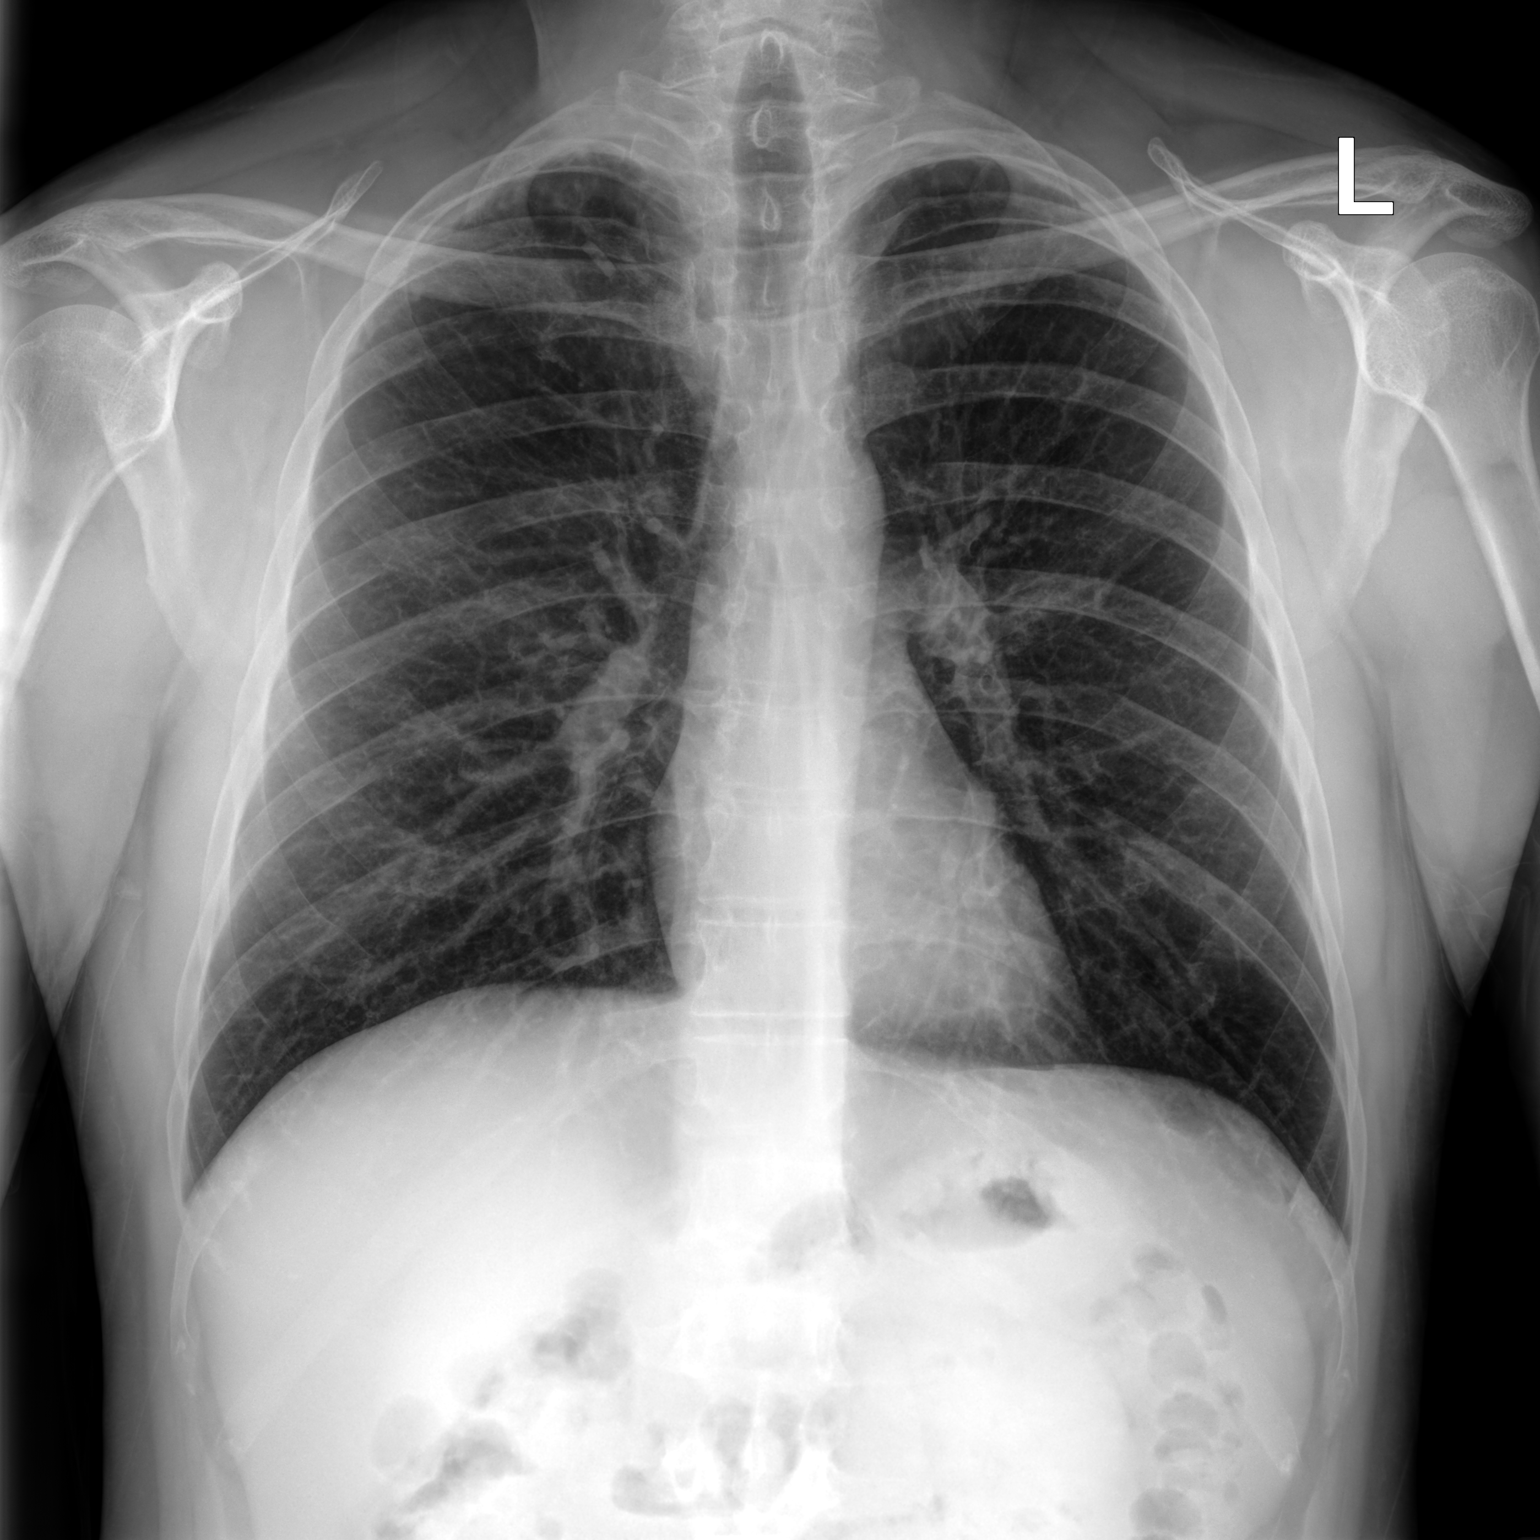

[chest lat]
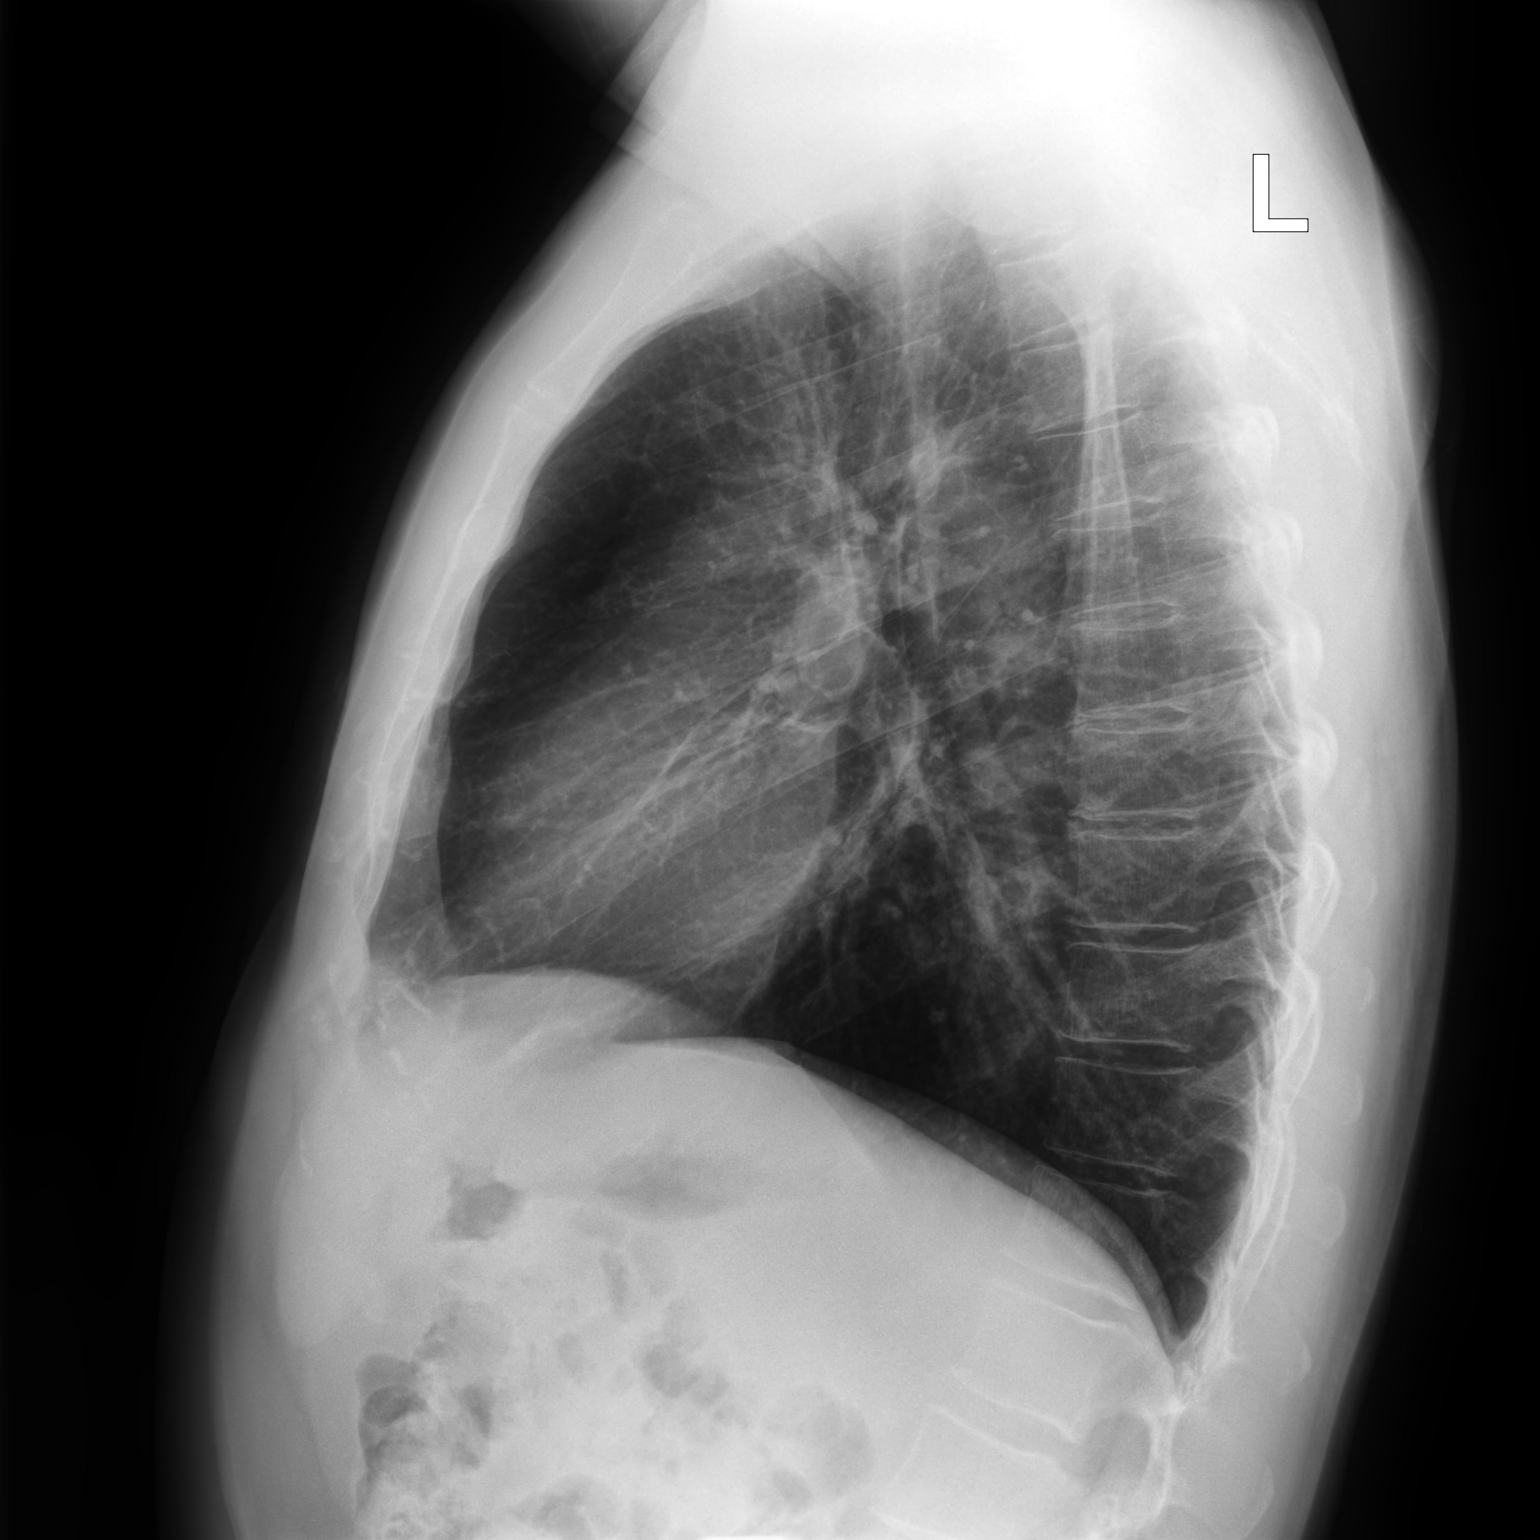

[2 of 2 positions shown; findings below may reference images not displayed]

FINDINGS: Patchy coarse densities at the lung apices likely represent chronic
changes and scarring. There may be mild hyperinflation of the lungs.
No focal airspace disease or consolidation. Heart and mediastinum
are within normal limits. Trachea is midline. No pleural effusions.
IMPRESSION: Evidence for scarring and/or chronic changes at the lung apices.

No acute chest findings.

## 2020-06-19 ENCOUNTER — Other Ambulatory Visit: Payer: Self-pay | Admitting: Family Medicine

## 2020-06-22 ENCOUNTER — Other Ambulatory Visit: Payer: Self-pay | Admitting: Family Medicine

## 2020-07-09 DIAGNOSIS — Z20828 Contact with and (suspected) exposure to other viral communicable diseases: Secondary | ICD-10-CM | POA: Diagnosis not present

## 2020-07-09 DIAGNOSIS — R519 Headache, unspecified: Secondary | ICD-10-CM | POA: Diagnosis not present

## 2020-11-05 ENCOUNTER — Other Ambulatory Visit: Payer: Self-pay | Admitting: Family Medicine

## 2020-12-31 ENCOUNTER — Encounter: Payer: Self-pay | Admitting: Family Medicine

## 2020-12-31 ENCOUNTER — Other Ambulatory Visit: Payer: Self-pay

## 2020-12-31 ENCOUNTER — Ambulatory Visit (INDEPENDENT_AMBULATORY_CARE_PROVIDER_SITE_OTHER): Payer: BC Managed Care – PPO | Admitting: Family Medicine

## 2020-12-31 VITALS — BP 118/78 | HR 92 | Temp 98.3°F | Wt 172.0 lb

## 2020-12-31 DIAGNOSIS — M791 Myalgia, unspecified site: Secondary | ICD-10-CM | POA: Diagnosis not present

## 2020-12-31 DIAGNOSIS — J019 Acute sinusitis, unspecified: Secondary | ICD-10-CM | POA: Diagnosis not present

## 2020-12-31 DIAGNOSIS — E039 Hypothyroidism, unspecified: Secondary | ICD-10-CM | POA: Diagnosis not present

## 2020-12-31 MED ORDER — DOXYCYCLINE HYCLATE 100 MG PO CAPS: 100.0000 mg | ORAL_CAPSULE | Freq: Two times a day (BID) | ORAL | 0 refills | Status: AC

## 2020-12-31 NOTE — Progress Notes (Signed)
   Subjective:    Patient ID: Benjamin Owen, male    DOB: 1975-12-31, 45 y.o.   MRN: [id number]  HPI Here for several issues. First about 10 days ago he developed fatigue, sinus congestion, a ST, and muscle aches. No fever or cough. No SOB or NVD. He has tested negative for the Covid-19 virus twice in the last week. No one else in his family feels bad. He is drinking fluids and taking Tylenol. He is concerned about the possibility of a tick bite, though he has not seen any ticks. No rashes. Also he asks to have some labs drawn. We have not checked his thyroid levels for several years.    Review of Systems  Constitutional:  Positive for fatigue. Negative for fever.  HENT:  Positive for congestion, postnasal drip, sinus pressure and sore throat.   Eyes: Negative.   Respiratory: Negative.    Cardiovascular: Negative.   Gastrointestinal: Negative.   Musculoskeletal:  Positive for myalgias.  Skin: Negative.       Objective:   Physical Exam Constitutional:      Appearance: Normal appearance. He is not ill-appearing.  HENT:     Right Ear: Tympanic membrane, ear canal and external ear normal.     Left Ear: Tympanic membrane, ear canal and external ear normal.     Nose: Nose normal.     Mouth/Throat:     Pharynx: Oropharynx is clear.  Eyes:     Conjunctiva/sclera: Conjunctivae normal.  Cardiovascular:     Rate and Rhythm: Normal rate and regular rhythm.     Pulses: Normal pulses.     Heart sounds: Normal heart sounds.  Pulmonary:     Effort: Pulmonary effort is normal.     Breath sounds: Normal breath sounds.  Lymphadenopathy:     Cervical: No cervical adenopathy.  Neurological:     Mental Status: He is alert.          Assessment & Plan:  He likely has a sinusitis, so we will treat this with 10 days of Doxycycline. This would cover any tick borne illnesses as well. We will get labs today including a thyroid panel.  Gershon Crane, MD

## 2021-01-01 LAB — CBC WITH DIFFERENTIAL/PLATELET
Absolute Monocytes: 574 cells/uL (ref 200–950)
Basophils Absolute: 42 cells/uL (ref 0–200)
Basophils Relative: 0.6 %
Eosinophils Absolute: 168 cells/uL (ref 15–500)
Eosinophils Relative: 2.4 %
HCT: 43.8 % (ref 38.5–50.0)
Hemoglobin: 14.9 g/dL (ref 13.2–17.1)
Lymphs Abs: 2527 cells/uL (ref 850–3900)
MCH: 29.9 pg (ref 27.0–33.0)
MCHC: 34 g/dL (ref 32.0–36.0)
MCV: 87.8 fL (ref 80.0–100.0)
MPV: 11.1 fL (ref 7.5–12.5)
Monocytes Relative: 8.2 %
Neutro Abs: 3689 cells/uL (ref 1500–7800)
Neutrophils Relative %: 52.7 %
Platelets: 281 10*3/uL (ref 140–400)
RBC: 4.99 10*6/uL (ref 4.20–5.80)
RDW: 12.6 % (ref 11.0–15.0)
Total Lymphocyte: 36.1 %
WBC: 7 10*3/uL (ref 3.8–10.8)

## 2021-01-01 LAB — HEPATIC FUNCTION PANEL
AG Ratio: 2.1 (calc) (ref 1.0–2.5)
ALT: 42 U/L (ref 9–46)
AST: 19 U/L (ref 10–40)
Albumin: 4.4 g/dL (ref 3.6–5.1)
Alkaline phosphatase (APISO): 52 U/L (ref 36–130)
Bilirubin, Direct: 0.1 mg/dL (ref 0.0–0.2)
Globulin: 2.1 g/dL (calc) (ref 1.9–3.7)
Indirect Bilirubin: 0.2 mg/dL (calc) (ref 0.2–1.2)
Total Bilirubin: 0.3 mg/dL (ref 0.2–1.2)
Total Protein: 6.5 g/dL (ref 6.1–8.1)

## 2021-01-01 LAB — BASIC METABOLIC PANEL
BUN: 22 mg/dL (ref 7–25)
CO2: 28 mmol/L (ref 20–32)
Calcium: 9.7 mg/dL (ref 8.6–10.3)
Chloride: 101 mmol/L (ref 98–110)
Creat: 0.92 mg/dL (ref 0.60–1.35)
Glucose, Bld: 85 mg/dL (ref 65–99)
Potassium: 4.5 mmol/L (ref 3.5–5.3)
Sodium: 139 mmol/L (ref 135–146)

## 2021-01-01 LAB — TSH: TSH: 1.96 mIU/L (ref 0.40–4.50)

## 2021-01-01 LAB — T3, FREE: T3, Free: 3.3 pg/mL (ref 2.3–4.2)

## 2021-01-01 LAB — CK: Total CK: 102 U/L (ref 44–196)

## 2021-01-01 LAB — T4, FREE: Free T4: 1.4 ng/dL (ref 0.8–1.8)

## 2021-01-07 ENCOUNTER — Encounter: Payer: Self-pay | Admitting: Family Medicine

## 2021-01-11 NOTE — Telephone Encounter (Signed)
Yes his results were normal. Set up another OV with me to discuss this

## 2021-02-25 ENCOUNTER — Other Ambulatory Visit: Payer: Self-pay | Admitting: Family Medicine

## 2021-02-25 ENCOUNTER — Other Ambulatory Visit: Payer: Self-pay

## 2021-02-25 MED ORDER — PANTOPRAZOLE SODIUM 40 MG PO TBEC: 40.0000 mg | DELAYED_RELEASE_TABLET | Freq: Two times a day (BID) | ORAL | 3 refills | Status: DC

## 2021-03-31 DIAGNOSIS — Z20822 Contact with and (suspected) exposure to covid-19: Secondary | ICD-10-CM | POA: Diagnosis not present

## 2021-03-31 DIAGNOSIS — R509 Fever, unspecified: Secondary | ICD-10-CM | POA: Diagnosis not present

## 2021-03-31 DIAGNOSIS — J029 Acute pharyngitis, unspecified: Secondary | ICD-10-CM | POA: Diagnosis not present

## 2021-03-31 DIAGNOSIS — R0981 Nasal congestion: Secondary | ICD-10-CM | POA: Diagnosis not present

## 2021-04-20 ENCOUNTER — Other Ambulatory Visit: Payer: Self-pay | Admitting: Family Medicine

## 2021-05-05 ENCOUNTER — Telehealth: Payer: Self-pay | Admitting: Family Medicine

## 2021-05-05 DIAGNOSIS — R110 Nausea: Secondary | ICD-10-CM | POA: Diagnosis not present

## 2021-05-05 DIAGNOSIS — E039 Hypothyroidism, unspecified: Secondary | ICD-10-CM

## 2021-05-05 DIAGNOSIS — J3489 Other specified disorders of nose and nasal sinuses: Secondary | ICD-10-CM | POA: Diagnosis not present

## 2021-05-05 DIAGNOSIS — Z9109 Other allergy status, other than to drugs and biological substances: Secondary | ICD-10-CM | POA: Diagnosis not present

## 2021-05-05 DIAGNOSIS — Z20822 Contact with and (suspected) exposure to covid-19: Secondary | ICD-10-CM | POA: Diagnosis not present

## 2021-05-05 NOTE — Telephone Encounter (Signed)
Patient would like lab orders to check thyroid, states he has felt tired lately and it has been a while since it has been checked. Patient just wanted message sent back, but will schedule to see Dr.Fry if neccessary    Good callback number is [phone]     Please Advise

## 2021-05-05 NOTE — Telephone Encounter (Signed)
Last office visit with TSH labs- 12/31/20.  Patient currently takes Synthroid 100 mcg, 1 tab qd.   Please advise

## 2021-05-06 NOTE — Telephone Encounter (Signed)
The orders were placed  ?

## 2021-05-06 NOTE — Telephone Encounter (Signed)
Lvm for patient to call office to schedule lab appointment only.

## 2021-05-18 ENCOUNTER — Encounter: Payer: Self-pay | Admitting: Family Medicine

## 2021-06-09 ENCOUNTER — Telehealth (INDEPENDENT_AMBULATORY_CARE_PROVIDER_SITE_OTHER): Payer: BC Managed Care – PPO | Admitting: Family Medicine

## 2021-06-09 ENCOUNTER — Encounter: Payer: Self-pay | Admitting: Family Medicine

## 2021-06-09 VITALS — Temp 98.0°F | Ht 68.0 in | Wt 165.0 lb

## 2021-06-09 DIAGNOSIS — R6889 Other general symptoms and signs: Secondary | ICD-10-CM | POA: Diagnosis not present

## 2021-06-09 MED ORDER — ONDANSETRON HCL 4 MG PO TABS: 4.0000 mg | ORAL_TABLET | Freq: Three times a day (TID) | ORAL | 0 refills | Status: DC | PRN

## 2021-06-09 NOTE — Patient Instructions (Signed)
  HOME CARE TIPS:  -I sent the medication(s) we discussed to your pharmacy: Meds ordered this encounter  Medications   ondansetron (ZOFRAN) 4 MG tablet    Sig: Take 1 tablet (4 mg total) by mouth every 8 (eight) hours as needed for nausea or vomiting.    Dispense:  20 tablet    Refill:  0     -can use tylenol  if needed for fevers, aches and pains per instructions  -can use nasal saline a few times per day if you have nasal congestion  -can try robitussin or musinex for cough  -stay hydrated, drink plenty of fluids and eat small healthy meals - avoid dairy  -follow up with your doctor in 2-3 days unless improving and feeling better   It was nice to meet you today, and I really hope you are feeling better soon. I help [facility] out with telemedicine visits on Tuesdays and Thursdays and am available for visits on those days. If you have any concerns or questions following this visit please schedule a follow up visit with your Primary Care doctor or seek care at a local urgent care clinic to avoid delays in care.    Seek in person care or schedule a follow up video visit promptly if your symptoms worsen, new concerns arise or you are not improving with treatment. Call 911 and/or seek emergency care if your symptoms are severe or life threatening.

## 2021-06-09 NOTE — Progress Notes (Signed)
Virtual Visit via Video Note  I connected with Benjamin Owen  on 06/09/21 at 12:00 PM EST by a video enabled telemedicine application and verified that I am speaking with the correct person using two identifiers.  Location patient: home, Benjamin Owen Location provider:work or home office Persons participating in the virtual visit: patient, provider  I discussed the limitations of evaluation and management by telemedicine and the availability of in person appointments. The patient expressed understanding and agreed to proceed.   HPI:  Acute telemedicine visit for flu like illness: -Onset: son had flu like illness with similar symtoms, then Benjamin Owen got sick a little over a week ago -Symptoms include: iniitally had fever, NV, diarrhea, chills, nasal congestion, cough -fever resolved about 4 days ago and reports is doing better but still has had some congestion, headache, cough and nausea/GERD, has some abd discomfort occasionally, occ diarrhea but much better -home covid test negative today -reports hx of heartburn on protonix -Denies: fevers, SOB, CP, NVD, abd pain currently, inability to tol oral intake, hematemesis or hemoptysis -Pertinent past medical history:see below -Pertinent medication allergies: Allergies  Allergen Reactions   Ultram [Tramadol Hcl] Nausea Only  -COVID-19 vaccine status: Immunization History  Administered Date(s) Administered   Hepatitis A 09/28/2016   Influenza,inj,Quad PF,6+ Mos 04/28/2017, 04/26/2018   Influenza-Unspecified 04/28/2017   Td 09/21/2009    ROS: See pertinent positives and negatives per HPI.  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 Jarold Motto, GERD only    LASIK     TONSILLECTOMY       Current Outpatient Medications:    [facility] ALLERGY RELIEF 180 MG tablet, TAKE 1 TABLET EVERY DAY, Disp: 30 tablet, Rfl: 6   escitalopram (LEXAPRO) 10 MG tablet, TAKE 1 TABLET BY MOUTH EVERY DAY, Disp: 90 tablet, Rfl: 3   levothyroxine (SYNTHROID) 100 MCG tablet, TAKE 1 TABLET BY MOUTH EVERY DAY, Disp: 90 tablet, Rfl: 0   montelukast (SINGULAIR) 10 MG tablet, TAKE 1 TABLET BY MOUTH EVERYDAY AT BEDTIME, Disp: 90 tablet, Rfl: 3   Multiple Vitamin (MULTIVITAMIN) capsule, Take 1 capsule by mouth daily.  , Disp: , Rfl:    ondansetron (ZOFRAN) 4 MG tablet, Take 1 tablet (4 mg total) by mouth every 8 (eight) hours as needed for nausea or vomiting., Disp: 20 tablet, Rfl: 0   pantoprazole (PROTONIX) 40 MG tablet, Take 1 tablet (40 mg total) by mouth 2 (two) times daily., Disp: 180 tablet, Rfl: 3  EXAM:  VITALS per patient if applicable:  GENERAL: alert, oriented, appears well and in no acute distress  HEENT: atraumatic, conjunttiva clear, no obvious abnormalities on inspection of external nose and ears  NECK: normal movements of the head and neck  LUNGS: on inspection no signs of respiratory distress, breathing rate appears normal, no obvious gross SOB, gasping or wheezing  CV: no obvious cyanosis  MS: moves all visible extremities without noticeable abnormality  PSYCH/NEURO: pleasant and cooperative, no obvious depression or anxiety, speech and thought processing grossly intact  ASSESSMENT AND PLAN:  Discussed the following assessment and plan:  Flu-like  symptoms  -we discussed possible serious and likely etiologies, options for evaluation and workup, limitations of telemedicine visit vs in person visit, treatment, treatment risks and precautions. Pt is agreeable to treatment via telemedicine at this moment. It sounds like he had viral illness (perhaps flu or covid) and reports is  improving with resolution of fevers. Discussed staying hydrated, avoidance of dairy and sent zofran for nausea and other symptomatic care measures per patient instructions.  Work/School slipped offered: declined Advised to seek prompt in person care if worsening, new symptoms arise, or if is not improving with treatment over the next few days. Discussed options for inperson care if PCP office not available. Did let this patient know that I only do telemedicine on Tuesdays and Thursdays for [facility]. Advised to schedule follow up visit with PCP or [facility] if any further questions or concerns to avoid delays in care.   I discussed the assessment and treatment plan with the patient. The patient was provided an opportunity to ask questions and all were answered. The patient agreed with the plan and demonstrated an understanding of the instructions.     Terressa Koyanagi, DO

## 2021-07-28 DIAGNOSIS — J22 Unspecified acute lower respiratory infection: Secondary | ICD-10-CM | POA: Diagnosis not present

## 2021-07-28 DIAGNOSIS — J019 Acute sinusitis, unspecified: Secondary | ICD-10-CM | POA: Diagnosis not present

## 2021-08-14 ENCOUNTER — Other Ambulatory Visit: Payer: Self-pay | Admitting: Family Medicine

## 2021-08-22 ENCOUNTER — Other Ambulatory Visit: Payer: Self-pay | Admitting: Family Medicine

## 2021-09-20 DIAGNOSIS — J019 Acute sinusitis, unspecified: Secondary | ICD-10-CM | POA: Diagnosis not present

## 2021-09-20 DIAGNOSIS — S20212A Contusion of left front wall of thorax, initial encounter: Secondary | ICD-10-CM | POA: Diagnosis not present

## 2021-09-22 ENCOUNTER — Other Ambulatory Visit: Payer: Self-pay

## 2021-09-22 ENCOUNTER — Ambulatory Visit (INDEPENDENT_AMBULATORY_CARE_PROVIDER_SITE_OTHER): Payer: BC Managed Care – PPO | Admitting: Adult Health

## 2021-09-22 ENCOUNTER — Encounter: Payer: Self-pay | Admitting: Adult Health

## 2021-09-22 ENCOUNTER — Ambulatory Visit (INDEPENDENT_AMBULATORY_CARE_PROVIDER_SITE_OTHER): Payer: BC Managed Care – PPO

## 2021-09-22 VITALS — BP 122/88 | HR 81 | Temp 98.5°F | Ht 68.0 in | Wt 168.0 lb

## 2021-09-22 DIAGNOSIS — R079 Chest pain, unspecified: Secondary | ICD-10-CM | POA: Diagnosis not present

## 2021-09-22 DIAGNOSIS — S299XXA Unspecified injury of thorax, initial encounter: Secondary | ICD-10-CM

## 2021-09-22 MED ORDER — CYCLOBENZAPRINE HCL 10 MG PO TABS: 10.0000 mg | ORAL_TABLET | Freq: Every day | ORAL | 0 refills | Status: DC

## 2021-09-22 NOTE — Progress Notes (Signed)
? ?Subjective:  ? ? Patient ID: Benjamin Owen, male    DOB: 19-Dec-1975, 46 y.o.   MRN: [id number] ? 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 ? ?He presents to the office today for an acute issue. He reports that he was wresting with his 103 year old son  six days ago and sustained a left sided rib injury when his sons shoulder went into the patients ribs. . He went to urgent care on 09/20/2021 and reports that he had xrays done which did not show fracture.  ? ?He has been using Motrin without relief.  ? ?Over the last few days the pain has become more intense and last night when he was laying in bed he felt it hard to breath.  ? ? ?Review of Systems ?See HPI  ? ?Past Medical History:  ?Diagnosis Date  ? Allergy   ? Anal fissure   ? Anxiety   ? Carpal tunnel syndrome   ? Chronic headaches   ? Fatigue   ? GERD (gastroesophageal reflux disease)   ? 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 ? ?Social History  ? ?Socioeconomic History  ? Marital status: Married  ?  Spouse name: Not on file  ? Number of children: 2  ? Years of education: Not on file  ? Highest education level: Not on file  ?Occupational History  ? Occupation: Forensic scientist  ?  Employer: [employer]  ?Tobacco Use  ? Smoking status: Never  ? Smokeless tobacco: Never  ?Vaping Use  ? Vaping Use: Never used  ?Substance and Sexual Activity  ? Alcohol use: Yes  ?  Alcohol/week: 2.0 standard drinks  ?  Types: 2 Cans of beer per week  ? Drug use: No  ? Sexual activity: Yes  ?  Partners: Female  ?Other Topics Concern  ? Not on file  ?Social History Narrative  ? Not on file  ? ?Social Determinants of Health  ? ?Financial Resource Strain: Not on  file  ?Food Insecurity: Not on file  ?Transportation Needs: Not on file  ?Physical Activity: Not on file  ?Stress: Not on file  ?Social Connections: Not on file  ?Intimate Partner Violence: Not on file  ? ? ?Past Surgical History:  ?Procedure Laterality Date  ? APPENDECTOMY  07/10/2005  ? ESOPHAGOGASTRODUODENOSCOPY  01-15-08  ? per Dr. Jarold Motto, GERD only   ? LASIK    ? TONSILLECTOMY    ? ? ?Family History  ?Problem Relation Age of Onset  ? Heart disease Father   ? Liver disease Father   ?     cirrhosis  ? Hyperlipidemia Other   ? Hypertension Other   ? Colon cancer Neg Hx   ? Stomach cancer Neg Hx   ? Colon polyps Neg Hx   ? Pancreatic cancer Neg Hx   ? Esophageal cancer Neg Hx   ? Rectal cancer Neg Hx   ? ? ?Allergies  ?Allergen Reactions  ? Ultram [Tramadol Hcl] Nausea Only  ? ? ?Current Outpatient Medications on File Prior to Visit  ?Medication Sig Dispense Refill  ? [facility] ALLERGY RELIEF 180 MG tablet TAKE 1 TABLET EVERY DAY 30 tablet 6  ? escitalopram (LEXAPRO) 10 MG tablet TAKE  1 TABLET BY MOUTH EVERY DAY 90 tablet 0  ? levothyroxine (SYNTHROID) 100 MCG tablet TAKE 1 TABLET BY MOUTH EVERY DAY 90 tablet 0  ? montelukast (SINGULAIR) 10 MG tablet TAKE 1 TABLET BY MOUTH EVERYDAY AT BEDTIME 90 tablet 0  ? Multiple Vitamin (MULTIVITAMIN) capsule Take 1 capsule by mouth daily.      ? ondansetron (ZOFRAN) 4 MG tablet Take 1 tablet (4 mg total) by mouth every 8 (eight) hours as needed for nausea or vomiting. 20 tablet 0  ? pantoprazole (PROTONIX) 40 MG tablet Take 1 tablet (40 mg total) by mouth 2 (two) times daily. 180 tablet 3  ? ?No current facility-administered medications on file prior to visit.  ? ? ?BP 122/88   Pulse 81   Temp 98.5 ?F (36.9 ?C) (Oral)   Ht 5\' 8"  (1.727 m)   Wt 168 lb (76.2 kg)   SpO2 97%   BMI 25.54 kg/m?  ? ? ?   ?Objective:  ? Physical Exam ?Vitals and nursing note reviewed.  ?Cardiovascular:  ?   Rate and Rhythm: Normal rate and regular rhythm.  ?   Pulses: Normal pulses.  ?   Heart sounds:  Normal heart sounds.  ?Pulmonary:  ?   Effort: Pulmonary effort is normal.  ?   Breath sounds: Normal breath sounds.  ?Chest:  ? ? ?   Comments: Has tenderness to left flank. Most noticeable in intercostal spaces 4-6.  ?Musculoskeletal:     ?   General: Tenderness present. Normal range of motion.  ?Skin: ?   General: Skin is warm and dry.  ?   Capillary Refill: Capillary refill takes less than 2 seconds.  ?Neurological:  ?   General: No focal deficit present.  ?   Mental Status: He is alert and oriented to person, place, and time.  ?Psychiatric:     ?   Mood and Affect: Mood normal.     ?   Behavior: Behavior normal.     ?   Thought Content: Thought content normal.     ?   Judgment: Judgment normal.  ? ? ?   ?Assessment & Plan:  ?1. Traumatic injury of rib ?- Will repeat chest xray today  ?-  Will send in Flexeril to take at night to help with muscle pain. Likely bone bruise and will take some time to heal  ?- heating pad and gentle stretching exercises  ?- DG Chest 2 View; Future ?- cyclobenzaprine (FLEXERIL) 10 MG tablet; Take 1 tablet (10 mg total) by mouth at bedtime.  Dispense: 15 tablet; Refill: 0 ? ? , NP ? ?

## 2021-11-12 ENCOUNTER — Other Ambulatory Visit: Payer: Self-pay | Admitting: Family Medicine

## 2021-11-14 NOTE — Telephone Encounter (Signed)
Pt needs a lab appointment for further refills ?

## 2021-11-21 ENCOUNTER — Other Ambulatory Visit: Payer: Self-pay | Admitting: Family Medicine

## 2021-11-28 ENCOUNTER — Encounter: Payer: Self-pay | Admitting: Family Medicine

## 2021-11-28 ENCOUNTER — Ambulatory Visit (INDEPENDENT_AMBULATORY_CARE_PROVIDER_SITE_OTHER): Payer: BC Managed Care – PPO | Admitting: Family Medicine

## 2021-11-28 VITALS — BP 124/80 | HR 92 | Temp 98.6°F | Wt 170.2 lb

## 2021-11-28 DIAGNOSIS — R1011 Right upper quadrant pain: Secondary | ICD-10-CM

## 2021-11-28 DIAGNOSIS — K219 Gastro-esophageal reflux disease without esophagitis: Secondary | ICD-10-CM | POA: Diagnosis not present

## 2021-11-28 MED ORDER — SUCRALFATE 1 G PO TABS: 1.0000 g | ORAL_TABLET | Freq: Four times a day (QID) | ORAL | 2 refills | Status: DC

## 2021-11-28 NOTE — Progress Notes (Signed)
   Subjective:    Patient ID: Benjamin Owen, male    DOB: [birth year], 46 y.o.   MRN: [id number]  HPI Here for 3 weeks of intermittent nausea without vomiting and RUQ abdominal pain. The pain gets worse about 15 minutes after eating. No fever. His BMs are normal. He takes Pantoprazole BID. He thinks this has been related to increased work stress and he had been drinking a lot of coffee. Since then he has has been taking Sucralfate TID and he has decreased his use of coffee to no more than 2 cups a day. He has felt much better this past week.    Review of Systems  Constitutional: Negative.   Respiratory: Negative.    Cardiovascular: Negative.   Genitourinary: Negative.       Objective:   Physical Exam Constitutional:      Appearance: Normal appearance. He is not ill-appearing.  Cardiovascular:     Rate and Rhythm: Normal rate and regular rhythm.     Pulses: Normal pulses.     Heart sounds: Normal heart sounds.  Pulmonary:     Effort: Pulmonary effort is normal.     Breath sounds: Normal breath sounds.  Abdominal:     General: Abdomen is flat. Bowel sounds are normal. There is no distension.     Palpations: Abdomen is soft. There is no mass.     Tenderness: There is no guarding or rebound.     Hernia: No hernia is present.     Comments: Slight RUQ tenderness   Neurological:     Mental Status: He is alert.          Assessment & Plan:  He has had RUQ pain with nausea, and this is consistent with increase acid production for the stomach. We will set up an [place] to rule out gall bladder disease. I agree with cutting back on coffee intake. He can use Sucralfate as needed.  Alysia Penna, MD

## 2021-12-01 ENCOUNTER — Ambulatory Visit
Admission: RE | Admit: 2021-12-01 | Discharge: 2021-12-01 | Disposition: A | Discharge status: Home / Self Care | Payer: BC Managed Care – PPO | Source: Ambulatory Visit | Attending: Family Medicine | Admitting: Family Medicine

## 2021-12-01 DIAGNOSIS — R1011 Right upper quadrant pain: Secondary | ICD-10-CM

## 2022-02-08 ENCOUNTER — Other Ambulatory Visit: Payer: Self-pay | Admitting: Family Medicine

## 2022-05-05 ENCOUNTER — Other Ambulatory Visit: Payer: Self-pay | Admitting: Family Medicine

## 2022-05-12 DIAGNOSIS — R059 Cough, unspecified: Secondary | ICD-10-CM | POA: Diagnosis not present

## 2022-05-28 ENCOUNTER — Other Ambulatory Visit: Payer: Self-pay | Admitting: Family Medicine

## 2022-06-01 ENCOUNTER — Other Ambulatory Visit: Payer: Self-pay | Admitting: Family Medicine

## 2022-06-18 ENCOUNTER — Other Ambulatory Visit: Payer: Self-pay | Admitting: Family Medicine

## 2022-07-12 ENCOUNTER — Other Ambulatory Visit: Payer: Self-pay | Admitting: Family Medicine

## 2022-07-25 ENCOUNTER — Other Ambulatory Visit: Payer: Self-pay | Admitting: Family Medicine

## 2022-08-01 ENCOUNTER — Other Ambulatory Visit: Payer: Self-pay | Admitting: Family Medicine

## 2022-08-15 ENCOUNTER — Other Ambulatory Visit: Payer: Self-pay | Admitting: Family Medicine

## 2022-08-20 ENCOUNTER — Other Ambulatory Visit: Payer: Self-pay | Admitting: Family Medicine

## 2022-08-20 DIAGNOSIS — E039 Hypothyroidism, unspecified: Secondary | ICD-10-CM

## 2022-09-03 DIAGNOSIS — R0782 Intercostal pain: Secondary | ICD-10-CM | POA: Diagnosis not present

## 2022-09-03 DIAGNOSIS — M62838 Other muscle spasm: Secondary | ICD-10-CM | POA: Diagnosis not present

## 2022-09-03 DIAGNOSIS — S29011A Strain of muscle and tendon of front wall of thorax, initial encounter: Secondary | ICD-10-CM | POA: Diagnosis not present

## 2022-09-11 ENCOUNTER — Other Ambulatory Visit: Payer: Self-pay | Admitting: Family Medicine

## 2022-09-13 ENCOUNTER — Other Ambulatory Visit: Payer: Self-pay | Admitting: Family Medicine

## 2022-09-13 DIAGNOSIS — E039 Hypothyroidism, unspecified: Secondary | ICD-10-CM

## 2022-10-17 ENCOUNTER — Other Ambulatory Visit: Payer: Self-pay | Admitting: Family Medicine

## 2022-10-17 DIAGNOSIS — E039 Hypothyroidism, unspecified: Secondary | ICD-10-CM

## 2022-10-31 DIAGNOSIS — H1045 Other chronic allergic conjunctivitis: Secondary | ICD-10-CM | POA: Diagnosis not present

## 2022-11-13 ENCOUNTER — Other Ambulatory Visit: Payer: Self-pay | Admitting: Family Medicine

## 2022-11-13 DIAGNOSIS — E039 Hypothyroidism, unspecified: Secondary | ICD-10-CM

## 2022-12-12 ENCOUNTER — Other Ambulatory Visit: Payer: Self-pay | Admitting: Family Medicine

## 2022-12-18 ENCOUNTER — Other Ambulatory Visit: Payer: Self-pay | Admitting: Family Medicine

## 2022-12-18 DIAGNOSIS — E039 Hypothyroidism, unspecified: Secondary | ICD-10-CM

## 2023-01-01 ENCOUNTER — Other Ambulatory Visit: Payer: Self-pay | Admitting: Family Medicine

## 2023-01-17 ENCOUNTER — Other Ambulatory Visit: Payer: Self-pay | Admitting: Family Medicine

## 2023-01-17 DIAGNOSIS — E039 Hypothyroidism, unspecified: Secondary | ICD-10-CM

## 2023-01-18 ENCOUNTER — Other Ambulatory Visit: Payer: Self-pay | Admitting: Family Medicine

## 2023-01-18 DIAGNOSIS — E039 Hypothyroidism, unspecified: Secondary | ICD-10-CM

## 2023-01-19 NOTE — Telephone Encounter (Signed)
Pt needs an appt. (CPE) for further refills. Pt has not been seen in 2 years.

## 2023-01-24 ENCOUNTER — Other Ambulatory Visit: Payer: Self-pay | Admitting: Family Medicine

## 2023-01-28 ENCOUNTER — Other Ambulatory Visit: Payer: Self-pay | Admitting: Family Medicine

## 2023-02-15 ENCOUNTER — Other Ambulatory Visit: Payer: Self-pay | Admitting: Family Medicine

## 2023-02-15 DIAGNOSIS — E039 Hypothyroidism, unspecified: Secondary | ICD-10-CM

## 2023-02-20 ENCOUNTER — Other Ambulatory Visit: Payer: Self-pay | Admitting: Family Medicine

## 2023-03-19 ENCOUNTER — Other Ambulatory Visit: Payer: Self-pay | Admitting: Family Medicine

## 2023-03-27 ENCOUNTER — Ambulatory Visit (INDEPENDENT_AMBULATORY_CARE_PROVIDER_SITE_OTHER)
Admission: RE | Admit: 2023-03-27 | Discharge: 2023-03-27 | Disposition: A | Discharge status: Home / Self Care | Payer: BC Managed Care – PPO | Source: Ambulatory Visit | Attending: Family Medicine | Admitting: Family Medicine

## 2023-03-27 ENCOUNTER — Encounter: Payer: Self-pay | Admitting: Family Medicine

## 2023-03-27 ENCOUNTER — Ambulatory Visit (INDEPENDENT_AMBULATORY_CARE_PROVIDER_SITE_OTHER): Payer: BC Managed Care – PPO | Admitting: Family Medicine

## 2023-03-27 VITALS — BP 110/70 | HR 82 | Temp 97.5°F | Ht 68.0 in | Wt 172.4 lb

## 2023-03-27 DIAGNOSIS — R0781 Pleurodynia: Secondary | ICD-10-CM | POA: Diagnosis not present

## 2023-03-27 DIAGNOSIS — R079 Chest pain, unspecified: Secondary | ICD-10-CM | POA: Diagnosis not present

## 2023-03-27 NOTE — Patient Instructions (Addendum)
Please go to [facility]  central X-[facility]  - located [address] across the street from [place] - in the basement - Hours: 8:30-5:00 PM M-F (with lunch from 12:30- 1 PM). You do NOT need an appointment.    I want to rule out fracture of the ribs- you are very tender-could just be bone bruise or even muscular but lets make sure  Let me know if you change mind and need something beyond motrin for pain or need nausea medicine  Recommended follow up: Return for as needed for new, worsening, persistent symptoms.

## 2023-03-27 NOTE — Progress Notes (Signed)
Phone [phone] In person visit   Subjective:   Benjamin Owen is a 47 y.o. year old very pleasant male patient who presents for/with See problem oriented charting Chief Complaint  Patient presents with   Pain Management   Abdominal Pain    Pt c/o LLQ pain near his rib cage that started last night, his son picked him up over the weekend. C/O nausea and dizziness.   Past Medical History-  Patient Active Problem List   Diagnosis Date Noted   Hypothyroidism 02/16/2015   GERD (gastroesophageal reflux disease) 01/05/2011   Chronic RLQ pain 12/29/2010   CARPAL TUNNEL SYNDROME 07/06/2010   ALLERGIC RHINITIS 10/09/2007   ASTHMA 10/09/2007   HEADACHE 10/09/2007    Medications- reviewed and updated Current Outpatient Medications  Medication Sig Dispense Refill   [facility] ALLERGY RELIEF 180 MG tablet TAKE 1 TABLET EVERY DAY 30 tablet 6   escitalopram (LEXAPRO) 10 MG tablet TAKE 1 TABLET BY MOUTH EVERY DAY 30 tablet 0   levothyroxine (SYNTHROID) 100 MCG tablet TAKE 1 TABLET BY MOUTH EVERY DAY. NEED PHYSICAL FOR REFILLS AND INS ONLY COVERS 90 DAYS 90 tablet 0   montelukast (SINGULAIR) 10 MG tablet TAKE 1 TABLET BY MOUTH EVERYDAY AT BEDTIME 30 tablet 0   Multiple Vitamin (MULTIVITAMIN) capsule Take 1 capsule by mouth daily.       pantoprazole (PROTONIX) 40 MG tablet TAKE 1 TABLET BY MOUTH TWICE A DAY 180 tablet 0   No current facility-administered medications for this visit.     Objective:  BP 110/70   Pulse 82   Temp (!) 97.5 F (36.4 C)   Ht 5\' 8"  (1.727 m)   Wt 172 lb 6.4 oz (78.2 kg)   SpO2 97%   BMI 26.21 kg/m  Gen: NAD, resting comfortably CV: RRR no murmurs rubs or gallops Lungs: CTAB no crackles, wheeze, rhonchi Very tender over left lower lateral ribs-difficult to get full exam due to pain level Abdomen: soft/nontender even in left upper quadrant except when going very laterally more toward the lower ribs on the left side almost at mid axillary  line/nondistended/normal bowel sounds. No rebound or guarding.  Ext: no edema Skin: warm, dry  EKG: sinus rhythm with rate 72, normal axis, normal intervals, no hypertrophy, no st or t wave changes     Assessment and Plan   # Left lower rib pain  S: Patient reports pain near his left lower rib cage and into left lower quadrant that started last night/Monday night.  His son picked him up over the weekend last friday and he thinks he may have sustained an injury related to this- only short term in that area related to that.  He does have some nausea and dizziness as well. Pain was bad enough to keep him up from sleeping last night at first but then when tried to get going to the office this morning/moving around noted worsening pain. Very tender to touch. Some mild hot flashes. No blood in stool. No vomiting. Some motrin last night  Interestingly enough on 09/22/2021 patient had a wrestling injury with his 68 year old son where his shoulder went in the patient's ribs.  X-rays have been done at urgent care prior to that visit and were reassuring  At baseline takes Lexapro 10 mg for anxiety as well as Singulair and over-the-counter antihistamine for allergies, Protonix for reflux and never fully controlled even with this.  levothyroxine for his thyroid.  A/P: Left lower rib pain rather significant on exam  after squeeze type injury from being picked up by his son last Friday - I do think we need to rule out rib fracture - No splint no megaly on exam and no tenderness in this area so do not think we need abdominal imaging unless he has new or worsening symptoms - He plans to use Motrin for pain and also offered Zofran for nausea but he declines.  Consider tramadol but he has an allergy   Recommended follow up: Return for as needed for new, worsening, persistent symptoms. No future appointments.  Lab/Order associations:   ICD-10-CM   1. Rib pain on left side  R07.81 DG Ribs Unilateral W/Chest Left     2. Chest pain, unspecified type  R07.9 EKG 12-Lead      Time Spent: 21 minutes of total time (1:06 PM- 1:27 PM) was spent on the date of the encounter performing the following actions: chart review prior to seeing the patient, obtaining history, performing a medically necessary exam, counseling on the workup and possible treatment plan, placing orders, and documenting in our EHR.  A separate 1 minute was used on EKG interpretation  Return precautions advised.  Tana Conch, MD

## 2023-04-02 ENCOUNTER — Telehealth: Payer: Self-pay | Admitting: Family Medicine

## 2023-04-02 NOTE — Telephone Encounter (Signed)
Pt called through Access Nurse on 03/31/23 wanting xray results. Would like a call back.

## 2023-04-02 NOTE — Telephone Encounter (Signed)
Returned pt call and made aware that there is still a shortage with the radiologist that reads the imaging so we don't have any results to give at this time. Informed pt to continue to check his mychart for results.

## 2023-04-08 ENCOUNTER — Other Ambulatory Visit: Payer: Self-pay | Admitting: Family Medicine

## 2023-05-19 ENCOUNTER — Other Ambulatory Visit: Payer: Self-pay | Admitting: Family Medicine

## 2023-05-19 DIAGNOSIS — E039 Hypothyroidism, unspecified: Secondary | ICD-10-CM

## 2023-05-22 ENCOUNTER — Telehealth: Payer: Self-pay

## 2023-05-22 NOTE — Telephone Encounter (Signed)
Called patient and explained the reason for me calling. Patient was thankful that I called and he is now scheduled to see Dr. Clent Ridges on 06/22/23 at 8:45 AM. Patient was informed to arrive 15 minutes before scheduled time to allow for check in process and to update any needed information and to be fasting for labs. Patient verbalized understanding and all (if any) questions were answered.

## 2023-05-22 NOTE — Telephone Encounter (Signed)
Medication: Escitalopram 10 mg  Directions: take 1 tablet by mouth daily  Last given: 04/11/23 Number refills: 0 Last o/v:  03/27/23-Acute OV ([facility]), 11/28/21-[facility] Follow up:  Labs: 12/31/20  Medication: Levothyroxine 100 mcg Directions:Take 1 tablet by mouth daily  Last given: 02/15/23 Number refills:  Last o/v:  03/27/23-Acute OV ([facility]), 11/28/21-[facility] Follow up:  Labs: 12/31/20  Medication: Montelukast 10 mg  Directions: Take 1 tablet by mouth at bedtime  Last given: 04/11/23 Number refills: 0 Last o/v:  03/27/23-Acute OV ([facility]), 11/28/21-[facility] Follow up:  Labs: 12/31/20  Medication: Pantoprazole 40 mg  Directions: Take 1 tablet by mouth 2 times a day  Last given: 03/21/23 Number refills: 0 Last o/v: 03/27/23-Acute OV ([facility]), 11/28/21-[facility] Follow up:  Labs: 12/31/20

## 2023-05-22 NOTE — Telephone Encounter (Signed)
Pt checking on progress of refills

## 2023-05-22 NOTE — Telephone Encounter (Signed)
Noted  

## 2023-05-28 ENCOUNTER — Ambulatory Visit: Payer: BC Managed Care – PPO | Admitting: Family Medicine

## 2023-06-17 ENCOUNTER — Other Ambulatory Visit: Payer: Self-pay | Admitting: Family Medicine

## 2023-06-18 ENCOUNTER — Other Ambulatory Visit: Payer: Self-pay | Admitting: Family Medicine

## 2023-06-18 DIAGNOSIS — E039 Hypothyroidism, unspecified: Secondary | ICD-10-CM

## 2023-06-22 ENCOUNTER — Encounter: Payer: BC Managed Care – PPO | Admitting: Family Medicine

## 2023-06-26 ENCOUNTER — Ambulatory Visit (INDEPENDENT_AMBULATORY_CARE_PROVIDER_SITE_OTHER): Payer: BC Managed Care – PPO | Admitting: Family Medicine

## 2023-06-26 ENCOUNTER — Encounter: Payer: Self-pay | Admitting: Family Medicine

## 2023-06-26 VITALS — BP 110/76 | HR 70 | Temp 98.1°F | Ht 68.5 in | Wt 171.6 lb

## 2023-06-26 DIAGNOSIS — Z23 Encounter for immunization: Secondary | ICD-10-CM | POA: Diagnosis not present

## 2023-06-26 DIAGNOSIS — E039 Hypothyroidism, unspecified: Secondary | ICD-10-CM | POA: Diagnosis not present

## 2023-06-26 DIAGNOSIS — Z131 Encounter for screening for diabetes mellitus: Secondary | ICD-10-CM

## 2023-06-26 DIAGNOSIS — F411 Generalized anxiety disorder: Secondary | ICD-10-CM | POA: Insufficient documentation

## 2023-06-26 DIAGNOSIS — Z0000 Encounter for general adult medical examination without abnormal findings: Secondary | ICD-10-CM | POA: Diagnosis not present

## 2023-06-26 LAB — CBC WITH DIFFERENTIAL/PLATELET
Basophils Absolute: 0 10*3/uL (ref 0.0–0.1)
Basophils Relative: 0.4 % (ref 0.0–3.0)
Eosinophils Absolute: 0.2 10*3/uL (ref 0.0–0.7)
Eosinophils Relative: 2.6 % (ref 0.0–5.0)
HCT: 44.5 % (ref 39.0–52.0)
Hemoglobin: 15.1 g/dL (ref 13.0–17.0)
Lymphocytes Relative: 20.4 % (ref 12.0–46.0)
Lymphs Abs: 1.2 10*3/uL (ref 0.7–4.0)
MCHC: 34 g/dL (ref 30.0–36.0)
MCV: 88.9 fL (ref 78.0–100.0)
Monocytes Absolute: 0.7 10*3/uL (ref 0.1–1.0)
Monocytes Relative: 11.4 % (ref 3.0–12.0)
Neutro Abs: 3.9 10*3/uL (ref 1.4–7.7)
Neutrophils Relative %: 65.2 % (ref 43.0–77.0)
Platelets: 235 10*3/uL (ref 150.0–400.0)
RBC: 5.01 Mil/uL (ref 4.22–5.81)
RDW: 13 % (ref 11.5–15.5)
WBC: 5.9 10*3/uL (ref 4.0–10.5)

## 2023-06-26 LAB — BASIC METABOLIC PANEL
BUN: 15 mg/dL (ref 6–23)
CO2: 32 meq/L (ref 19–32)
Calcium: 9.1 mg/dL (ref 8.4–10.5)
Chloride: 101 meq/L (ref 96–112)
Creatinine, Ser: 0.96 mg/dL (ref 0.40–1.50)
GFR: 94.95 mL/min (ref >60.00)
Glucose, Bld: 91 mg/dL (ref 70–99)
Potassium: 4.5 meq/L (ref 3.5–5.1)
Sodium: 139 meq/L (ref 135–145)

## 2023-06-26 LAB — HEPATIC FUNCTION PANEL
ALT: 33 U/L (ref 0–53)
AST: 22 U/L (ref 0–37)
Albumin: 4.6 g/dL (ref 3.5–5.2)
Alkaline Phosphatase: 64 U/L (ref 39–117)
Bilirubin, Direct: 0.1 mg/dL (ref 0.0–0.3)
Total Bilirubin: 0.6 mg/dL (ref 0.2–1.2)
Total Protein: 6.6 g/dL (ref 6.0–8.3)

## 2023-06-26 LAB — T4, FREE: Free T4: 0.97 ng/dL (ref 0.60–1.60)

## 2023-06-26 LAB — LIPID PANEL
Cholesterol: 228 mg/dL — ABNORMAL HIGH (ref 0–200)
HDL: 42.3 mg/dL (ref >39.00)
LDL Cholesterol: 131 mg/dL — ABNORMAL HIGH (ref 0–99)
NonHDL: 185.87
Total CHOL/HDL Ratio: 5
Triglycerides: 274 mg/dL — ABNORMAL HIGH (ref 0.0–149.0)
VLDL: 54.8 mg/dL — ABNORMAL HIGH (ref 0.0–40.0)

## 2023-06-26 LAB — T3, FREE: T3, Free: 3.4 pg/mL (ref 2.3–4.2)

## 2023-06-26 LAB — TSH: TSH: 2.39 u[IU]/mL (ref 0.35–5.50)

## 2023-06-26 LAB — HEMOGLOBIN A1C: Hgb A1c MFr Bld: 5.8 % (ref 4.6–6.5)

## 2023-06-26 MED ORDER — ESCITALOPRAM OXALATE 10 MG PO TABS: ORAL_TABLET | ORAL | 3 refills | Status: DC

## 2023-06-26 MED ORDER — LEVOTHYROXINE SODIUM 100 MCG PO TABS: 100.0000 ug | ORAL_TABLET | Freq: Every day | ORAL | 3 refills | Status: DC

## 2023-06-26 MED ORDER — PANTOPRAZOLE SODIUM 40 MG PO TBEC: 40.0000 mg | DELAYED_RELEASE_TABLET | Freq: Two times a day (BID) | ORAL | 3 refills | Status: DC

## 2023-06-26 MED ORDER — MONTELUKAST SODIUM 10 MG PO TABS: ORAL_TABLET | ORAL | 3 refills | Status: DC

## 2023-06-26 NOTE — Addendum Note (Signed)
Addended by: Carola Rhine on: 06/26/2023 09:39 AM   Modules accepted: Orders

## 2023-06-26 NOTE — Progress Notes (Signed)
   Subjective:    Patient ID: Benjamin Owen, male    DOB: [birth year], 47 y.o.   MRN: [id number]  HPI Here for a well exam. He feels fine.    Review of Systems  Constitutional: Negative.   HENT: Negative.    Eyes: Negative.   Respiratory: Negative.    Cardiovascular: Negative.   Gastrointestinal: Negative.   Genitourinary: Negative.   Musculoskeletal: Negative.   Skin: Negative.   Neurological: Negative.   Psychiatric/Behavioral: Negative.         Objective:   Physical Exam Constitutional:      General: He is not in acute distress.    Appearance: Normal appearance. He is well-developed. He is not diaphoretic.  HENT:     Head: Normocephalic and atraumatic.     Right Ear: External ear normal.     Left Ear: External ear normal.     Nose: Nose normal.     Mouth/Throat:     Pharynx: No oropharyngeal exudate.  Eyes:     General: No scleral icterus.       Right eye: No discharge.        Left eye: No discharge.     Conjunctiva/sclera: Conjunctivae normal.     Pupils: Pupils are equal, round, and reactive to light.  Neck:     Thyroid: No thyromegaly.     Vascular: No JVD.     Trachea: No tracheal deviation.  Cardiovascular:     Rate and Rhythm: Normal rate and regular rhythm.     Pulses: Normal pulses.     Heart sounds: Normal heart sounds. No murmur heard.    No friction rub. No gallop.  Pulmonary:     Effort: Pulmonary effort is normal. No respiratory distress.     Breath sounds: Normal breath sounds. No wheezing or rales.  Chest:     Chest wall: No tenderness.  Abdominal:     General: Bowel sounds are normal. There is no distension.     Palpations: Abdomen is soft. There is no mass.     Tenderness: There is no abdominal tenderness. There is no guarding or rebound.  Genitourinary:    Penis: Normal. No tenderness.      Testes: Normal.  Musculoskeletal:        General: No tenderness. Normal range of motion.     Cervical back: Neck supple.  Lymphadenopathy:      Cervical: No cervical adenopathy.  Skin:    General: Skin is warm and dry.     Coloration: Skin is not pale.     Findings: No erythema or rash.  Neurological:     General: No focal deficit present.     Mental Status: He is alert and oriented to person, place, and time.     Cranial Nerves: No cranial nerve deficit.     Motor: No abnormal muscle tone.     Coordination: Coordination normal.     Deep Tendon Reflexes: Reflexes are normal and symmetric. Reflexes normal.  Psychiatric:        Mood and Affect: Mood normal.        Behavior: Behavior normal.        Thought Content: Thought content normal.        Judgment: Judgment normal.           Assessment & Plan:  Well exam. We discussed diet and exercise. Get fasting labs. Gershon Crane, MD

## 2023-06-29 ENCOUNTER — Other Ambulatory Visit: Payer: Self-pay

## 2023-06-29 MED ORDER — ATORVASTATIN CALCIUM 10 MG PO TABS: 10.0000 mg | ORAL_TABLET | Freq: Every day | ORAL | 3 refills | Status: DC

## 2023-08-24 ENCOUNTER — Encounter: Payer: Self-pay | Admitting: Adult Health

## 2023-08-24 ENCOUNTER — Telehealth: Payer: BC Managed Care – PPO | Admitting: Adult Health

## 2023-08-24 VITALS — Ht 68.5 in | Wt 171.0 lb

## 2023-08-24 DIAGNOSIS — R110 Nausea: Secondary | ICD-10-CM | POA: Diagnosis not present

## 2023-08-24 DIAGNOSIS — K2100 Gastro-esophageal reflux disease with esophagitis, without bleeding: Secondary | ICD-10-CM

## 2023-08-24 DIAGNOSIS — R519 Headache, unspecified: Secondary | ICD-10-CM

## 2023-08-24 MED ORDER — SUCRALFATE 1 GM/10ML PO SUSP
1.0000 g | Freq: Three times a day (TID) | ORAL | 1 refills | Status: DC
Start: 2023-08-24 — End: 2024-05-06

## 2023-08-24 MED ORDER — ONDANSETRON HCL 4 MG PO TABS: 4.0000 mg | ORAL_TABLET | Freq: Three times a day (TID) | ORAL | 0 refills | Status: DC | PRN

## 2023-08-24 NOTE — Progress Notes (Signed)
Virtual Visit via Video Note  I connected with Benjamin Owen on 08/24/23 at  3:00 PM EST by a video enabled telemedicine application and verified that I am speaking with the correct person using two identifiers.  Location patient: home Location provider:work or home office Persons participating in the virtual visit: patient, provider  I discussed the limitations of evaluation and management by telemedicine and the availability of in person appointments. The patient expressed understanding and agreed to proceed.   HPI:  Benjamin Owen is a 48 year old male being evaluated today virtually.  He reports that about 2 weeks ago his son had influenza and then the week ago tested positive for influenza.  At this time he had bodyaches, chills, headaches, and nausea.  Over the last 3 to 4 days he has developed nausea, heartburn, burning sensation in his esophagus, worsening headaches, dizziness, nausea and has had diarrhea.  He also has noted that his stools have been darker ( almost black).  He does have a history of ulcers and GERD and takes Protonix 40 mg twice daily but does not feel as though his symptoms are controlled.  His last endoscopy was in 2020 which was essentially normal.  Over the past 3 to 4 days he has been using Carafate pills and has started to feel better but he is not sure if the Carafate is the cause that he is feeling better.  Has not had any fevers or abdominal pain/cramping.  He does not use NSAIDs.  Been taking Tylenol as needed for headaches and this seems to be helpful to some degree.    ROS: See pertinent positives and negatives per HPI.  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 Jarold Motto, GERD only    LASIK     TONSILLECTOMY      Family History  Problem Relation Age of Onset   Heart disease Father    Liver disease Father        cirrhosis   Hyperlipidemia Other    Hypertension Other    Colon cancer Neg Hx    Stomach cancer Neg Hx    Colon polyps Neg Hx    Pancreatic cancer Neg Hx    Esophageal cancer Neg Hx    Rectal cancer Neg Hx        Current Outpatient Medications:    atorvastatin (LIPITOR) 10 MG tablet, Take 1 tablet (10 mg total) by mouth daily., Disp: 90 tablet, Rfl: 3   [employer] ALLERGY RELIEF 180 MG tablet, TAKE 1 TABLET EVERY DAY, Disp: 30 tablet, Rfl: 6   escitalopram (LEXAPRO) 10 MG tablet, Take 1 tablet by mouth every day. Patient needs office visit for continuation of refills, Disp: 90 tablet, Rfl: 3   levothyroxine (SYNTHROID) 100 MCG tablet, Take 1 tablet (100 mcg total) by mouth daily before breakfast., Disp: 90 tablet, Rfl: 3   montelukast (SINGULAIR) 10 MG tablet, TAKE 1 TABLET BY MOUTH EVERYDAY AT BEDTIME. Patient needs office visit for continuation of refills, Disp: 90 tablet, Rfl: 3   Multiple Vitamin (MULTIVITAMIN) capsule, Take 1 capsule by  mouth daily.  , Disp: , Rfl:    ondansetron (ZOFRAN) 4 MG tablet, Take 1 tablet (4 mg total) by mouth every 8 (eight) hours as needed for nausea or vomiting., Disp: 20 tablet, Rfl: 0   pantoprazole (PROTONIX) 40 MG tablet, Take 1 tablet (40 mg total) by mouth 2 (two) times daily before a meal., Disp: 180 tablet, Rfl: 3   sucralfate (CARAFATE) 1 GM/10ML suspension, Take 10 mLs (1 g total) by mouth 4 (four) times daily -  with meals and at bedtime., Disp: 420 mL, Rfl: 1  EXAM:  VITALS per patient if applicable:  GENERAL: alert, oriented, appears well and in no acute distress  HEENT: atraumatic, conjunttiva clear, no obvious abnormalities on inspection of external nose and ears  NECK: normal movements of the head and  neck  LUNGS: on inspection no signs of respiratory distress, breathing rate appears normal, no obvious gross SOB, gasping or wheezing  CV: no obvious cyanosis  MS: moves all visible extremities without noticeable abnormality  PSYCH/NEURO: pleasant and cooperative, no obvious depression or anxiety, speech and thought processing grossly intact  ASSESSMENT AND PLAN:  Discussed the following assessment and plan:  1. Gastroesophageal reflux disease with esophagitis without hemorrhage (Primary) -Send in Carafate liquid to see if this works better for him.  Will also send in some Zofran.  Dark stools are concerning but unknown if this is truly blood.  Advise follow-up in the office early next week if symptoms have not improved/resolved. - sucralfate (CARAFATE) 1 GM/10ML suspension; Take 10 mLs (1 g total) by mouth 4 (four) times daily -  with meals and at bedtime.  Dispense: 420 mL; Refill: 1 - ondansetron (ZOFRAN) 4 MG tablet; Take 1 tablet (4 mg total) by mouth every 8 (eight) hours as needed for nausea or vomiting.  Dispense: 20 tablet; Refill: 0  2. Nausea  - sucralfate (CARAFATE) 1 GM/10ML suspension; Take 10 mLs (1 g total) by mouth 4 (four) times daily -  with meals and at bedtime.  Dispense: 420 mL; Refill: 1 - ondansetron (ZOFRAN) 4 MG tablet; Take 1 tablet (4 mg total) by mouth every 8 (eight) hours as needed for nausea or vomiting.  Dispense: 20 tablet; Refill: 0  3. Generalized headaches Possibly from GERD or nausea  - Increase fluids and stay hydrated.   Shirline Frees, NP        I discussed the assessment and treatment plan with the patient. The patient was provided an opportunity to ask questions and all were answered. The patient agreed with the plan and demonstrated an understanding of the instructions.   The patient was advised to call back or seek an in-person evaluation if the symptoms worsen or if the condition fails to improve as anticipated.   Shirline Frees, NP

## 2023-10-21 DIAGNOSIS — J020 Streptococcal pharyngitis: Secondary | ICD-10-CM | POA: Diagnosis not present

## 2023-10-21 DIAGNOSIS — R070 Pain in throat: Secondary | ICD-10-CM | POA: Diagnosis not present

## 2023-11-06 DIAGNOSIS — R1032 Left lower quadrant pain: Secondary | ICD-10-CM | POA: Diagnosis not present

## 2023-11-06 DIAGNOSIS — R350 Frequency of micturition: Secondary | ICD-10-CM | POA: Diagnosis not present

## 2023-11-06 DIAGNOSIS — M545 Low back pain, unspecified: Secondary | ICD-10-CM | POA: Diagnosis not present

## 2024-04-16 DIAGNOSIS — Z1211 Encounter for screening for malignant neoplasm of colon: Secondary | ICD-10-CM | POA: Diagnosis not present

## 2024-04-16 DIAGNOSIS — K219 Gastro-esophageal reflux disease without esophagitis: Secondary | ICD-10-CM | POA: Diagnosis not present

## 2024-04-20 DIAGNOSIS — M545 Low back pain, unspecified: Secondary | ICD-10-CM | POA: Diagnosis not present

## 2024-04-28 ENCOUNTER — Ambulatory Visit: Payer: Self-pay

## 2024-04-28 ENCOUNTER — Ambulatory Visit: Admitting: Family Medicine

## 2024-04-28 VITALS — BP 136/88 | HR 73 | Temp 97.7°F | Wt 174.0 lb

## 2024-04-28 DIAGNOSIS — N410 Acute prostatitis: Secondary | ICD-10-CM

## 2024-04-28 DIAGNOSIS — R10A3 Flank pain, bilateral: Secondary | ICD-10-CM

## 2024-04-28 LAB — POC URINALSYSI DIPSTICK (AUTOMATED)
Bilirubin, UA: NEGATIVE
Blood, UA: NEGATIVE
Glucose, UA: NEGATIVE
Ketones, UA: NEGATIVE
Leukocytes, UA: NEGATIVE
Nitrite, UA: NEGATIVE
Protein, UA: NEGATIVE
Spec Grav, UA: 1.015 (ref 1.010–1.025)
Urobilinogen, UA: 0.2 U/dL
pH, UA: 6.5 (ref 5.0–8.0)

## 2024-04-28 MED ORDER — CIPROFLOXACIN HCL 500 MG PO TABS: 500.0000 mg | ORAL_TABLET | Freq: Two times a day (BID) | ORAL | 0 refills | Status: DC

## 2024-04-28 NOTE — Progress Notes (Signed)
   Subjective:    Patient ID: Benjamin Owen, male    DOB: 1976/01/22, 48 y.o.   MRN: [id number]  HPI Here for 2 weeks of a dull pain in the lower back. This seems to move from one side to the other. No recent trauma. Using heat with no relief. He saw an urgent care 8 days ago, and they gave him Prednisone and a muscle relaxer. These have not helped either. Now for the past 2 days he has had increased frequency and urgency to urinate. No burning or fever. His UA here today is clear.    Review of Systems  Constitutional: Negative.   Respiratory: Negative.    Cardiovascular: Negative.   Gastrointestinal: Negative.   Genitourinary:  Positive for frequency and urgency. Negative for dysuria.  Musculoskeletal:  Positive for back pain.       Objective:   Physical Exam Constitutional:      Appearance: Normal appearance. He is not ill-appearing.  Cardiovascular:     Rate and Rhythm: Normal rate and regular rhythm.     Pulses: Normal pulses.     Heart sounds: Normal heart sounds.  Pulmonary:     Effort: Pulmonary effort is normal.     Breath sounds: Normal breath sounds.  Abdominal:     General: Abdomen is flat. Bowel sounds are normal. There is no distension.     Palpations: Abdomen is soft. There is no mass.     Tenderness: There is no abdominal tenderness. There is no right CVA tenderness, left CVA tenderness, guarding or rebound.     Hernia: No hernia is present.  Genitourinary:    Penis: Normal.      Comments: The left testicle is slightly tender. The prostate is swollen and is quite tender  Musculoskeletal:     Comments: Low back is not tender, ROM is full   Neurological:     Mental Status: He is alert.           Assessment & Plan:  Prostatitis. He will stop the muscle relaxers. We will treat with 10 days of Cipro . Recheck as needed.  Garnette Olmsted, MD

## 2024-04-28 NOTE — Telephone Encounter (Signed)
 Noted

## 2024-04-28 NOTE — Telephone Encounter (Signed)
 FYI Only or Action Required?: Action required by provider: request for appointment.  Patient was last seen in primary care on 08/24/2023 by Merna Huxley, NP.  Called Nurse Triage reporting Back Pain.  Symptoms began several days ago.  Interventions attempted: Nothing.  Symptoms are: unchanged.  Triage Disposition: See PCP When Office is Open (Within 3 Days)  Patient/caregiver understands and will follow disposition?: Yes   Copied from CRM #[id number]. Topic: Clinical - Red Word Triage >> Apr 28, 2024  8:47 AM Pinkey ORN wrote: Red Word that prompted transfer to Nurse Triage: Back Pain + Dizziness Reason for Disposition  [1] MODERATE back pain (e.g., interferes with normal activities) AND [2] present > 3 days  Answer Assessment - Initial Assessment Questions 1. ONSET: When did the pain begin? (e.g., minutes, hours, days)     Last week 2. LOCATION: Where does it hurt? (upper, mid or lower back)     lower 3. SEVERITY: How bad is the pain?  (e.g., Scale 1-10; mild, moderate, or severe)     4 4. PATTERN: Is the pain constant? (e.g., yes, no; constant, intermittent)      Constant 5. RADIATION: Does the pain shoot into your legs or somewhere else?     no 6. CAUSE:  What do you think is causing the back pain?      unsure 7. BACK OVERUSE:  Any recent lifting of heavy objects, strenuous work or exercise?     no 8. MEDICINES: What have you taken so far for the pain? (e.g., nothing, acetaminophen , NSAIDS)     motrin 9. NEUROLOGIC SYMPTOMS: Do you have any weakness, numbness, or problems with bowel/bladder control?     no 10. OTHER SYMPTOMS: Do you have any other symptoms? (e.g., fever, abdomen pain, burning with urination, blood in urine)       no 11. PREGNANCY: Is there any chance you are pregnant? When was your last menstrual period?       N/a  Protocols used: Back Pain-A-AH

## 2024-05-06 ENCOUNTER — Ambulatory Visit: Admitting: Family Medicine

## 2024-05-06 ENCOUNTER — Encounter: Payer: Self-pay | Admitting: Family Medicine

## 2024-05-06 VITALS — BP 120/78 | HR 73 | Temp 98.4°F | Wt 169.8 lb

## 2024-05-06 DIAGNOSIS — R079 Chest pain, unspecified: Secondary | ICD-10-CM

## 2024-05-06 DIAGNOSIS — R112 Nausea with vomiting, unspecified: Secondary | ICD-10-CM

## 2024-05-06 DIAGNOSIS — K219 Gastro-esophageal reflux disease without esophagitis: Secondary | ICD-10-CM | POA: Diagnosis not present

## 2024-05-06 MED ORDER — DICYCLOMINE HCL 20 MG PO TABS: 20.0000 mg | ORAL_TABLET | Freq: Four times a day (QID) | ORAL | 0 refills | Status: DC

## 2024-05-06 MED ORDER — FAMOTIDINE 40 MG PO TABS: 40.0000 mg | ORAL_TABLET | Freq: Every day | ORAL | Status: AC

## 2024-05-06 NOTE — Progress Notes (Signed)
 Subjective:    Patient ID: Benjamin Owen, male    DOB: 1976-07-08, 48 y.o.   MRN: [id number]  HPI Here with his wife to follow up on the symptoms he described to me on his visit on 04-28-24 and for different symptoms that started yesterday. At first he had low back pain along with increased urgency and frequency of urination. We felt he had a prostatitis, and we started him on Cipro . After 3 days of taking this, his back pain and urinary symptoms began to improve dramatically. However yesterday he began to experience pressure like chest pains that were center over the lower chest and epigastrium. These waxed and waned over a 24 hour period ans then they went away this morning. He has also been nauseated for 2 days, and he vomited twice last night. No abdominal pains. Still no fevers. His BM's has always been normal. Of note he has complained of similar chest pains over the past 15-20 years. He had an EGD on 01-07-19 which was totally unremarkable. He has had several EKG's in the past few years that were all normal. In 2023 he had a CXR that was normal, and he also had an abdominal US  that was totally normal. He is currently taking Pantoprazole  40 mg BID and Famotidine  40 mg at bedtime. He has been seeing Dr. Merdis Owen of [facility], and he is scheduled for upper and lower endoscopies on 06-27-24.    Review of Systems  Constitutional: Negative.   Respiratory: Negative.    Cardiovascular:  Positive for chest pain. Negative for palpitations and leg swelling.  Gastrointestinal:  Positive for abdominal pain, nausea and vomiting. Negative for abdominal distention, anal bleeding, blood in stool, constipation, diarrhea and rectal pain.  Genitourinary: Negative.        Objective:   Physical Exam Constitutional:      Appearance: Normal appearance. He is not ill-appearing.  Cardiovascular:     Rate and Rhythm: Normal rate and regular rhythm.     Pulses: Normal pulses.     Heart sounds: Normal  heart sounds.     Comments: EKG today is normal.  Pulmonary:     Effort: Pulmonary effort is normal.     Breath sounds: Normal breath sounds.  Chest:     Chest wall: No tenderness.  Abdominal:     General: Abdomen is flat. Bowel sounds are normal. There is no distension.     Palpations: Abdomen is soft. There is no mass.     Tenderness: There is no abdominal tenderness. There is no right CVA tenderness, left CVA tenderness, guarding or rebound.     Hernia: No hernia is present.  Neurological:     Mental Status: He is alert.           Assessment & Plan:  We have been treating him for a prostatitis, and this has been improving. However he now has chest pains with nausea and vomiting, and I believe these are GI issues that have been triggered by the Cipro . According I told him to stop taking the Cipro . He is on maximum strength acid suppression, so his chest symptoms are consistent with esophageal spasms. We will treat these with Dicyclomine 20 mg every 6 hours. Get labs today. I do not believe he is having a cardiac issue, but at his request we will refer him to Cardiology to evaluate further. He will have endoscopies in December as noted above. I personally spent a total of 35 minutes in  the care of the patient today including getting/reviewing separately obtained history, performing a medically appropriate exam/evaluation, counseling and educating, and referring and communicating with other health care professionals.  Garnette Olmsted, MD

## 2024-05-07 LAB — CBC WITH DIFFERENTIAL/PLATELET
Basophils Absolute: 0 K/uL (ref 0.0–0.1)
Basophils Relative: 0.3 % (ref 0.0–3.0)
Eosinophils Absolute: 0.1 K/uL (ref 0.0–0.7)
Eosinophils Relative: 0.6 % (ref 0.0–5.0)
HCT: 44.8 % (ref 39.0–52.0)
Hemoglobin: 14.8 g/dL (ref 13.0–17.0)
Lymphocytes Relative: 14.6 % (ref 12.0–46.0)
Lymphs Abs: 1.6 K/uL (ref 0.7–4.0)
MCHC: 33 g/dL (ref 30.0–36.0)
MCV: 89.2 fl (ref 78.0–100.0)
Monocytes Absolute: 1.3 K/uL — ABNORMAL HIGH (ref 0.1–1.0)
Monocytes Relative: 12.1 % — ABNORMAL HIGH (ref 3.0–12.0)
Neutro Abs: 8 K/uL — ABNORMAL HIGH (ref 1.4–7.7)
Neutrophils Relative %: 72.4 % (ref 43.0–77.0)
Platelets: 217 K/uL (ref 150.0–400.0)
RBC: 5.02 Mil/uL (ref 4.22–5.81)
RDW: 13.2 % (ref 11.5–15.5)
WBC: 11.1 K/uL — ABNORMAL HIGH (ref 4.0–10.5)

## 2024-05-07 LAB — HEPATIC FUNCTION PANEL
ALT: 28 U/L (ref 0–53)
AST: 30 U/L (ref 0–37)
Albumin: 4.5 g/dL (ref 3.5–5.2)
Alkaline Phosphatase: 55 U/L (ref 39–117)
Bilirubin, Direct: 0.2 mg/dL (ref 0.0–0.3)
Total Bilirubin: 0.8 mg/dL (ref 0.2–1.2)
Total Protein: 7 g/dL (ref 6.0–8.3)

## 2024-05-07 LAB — BASIC METABOLIC PANEL WITH GFR
BUN: 24 mg/dL — ABNORMAL HIGH (ref 6–23)
CO2: 30 meq/L (ref 19–32)
Calcium: 9.6 mg/dL (ref 8.4–10.5)
Chloride: 102 meq/L (ref 96–112)
Creatinine, Ser: 1.89 mg/dL — ABNORMAL HIGH (ref 0.40–1.50)
GFR: 41.86 mL/min — ABNORMAL LOW (ref >60.00)
Glucose, Bld: 81 mg/dL (ref 70–99)
Potassium: 4.2 meq/L (ref 3.5–5.1)
Sodium: 140 meq/L (ref 135–145)

## 2024-05-07 LAB — LIPASE: Lipase: 15 U/L (ref 11.0–59.0)

## 2024-05-08 ENCOUNTER — Ambulatory Visit: Payer: Self-pay | Admitting: Family Medicine

## 2024-05-08 NOTE — Addendum Note (Signed)
 Addended by: JOHNNY SENIOR A on: 05/08/2024 08:08 AM   Modules accepted: Orders

## 2024-05-12 ENCOUNTER — Encounter: Payer: Self-pay | Admitting: Family Medicine

## 2024-05-14 ENCOUNTER — Other Ambulatory Visit

## 2024-05-14 DIAGNOSIS — R112 Nausea with vomiting, unspecified: Secondary | ICD-10-CM

## 2024-05-16 LAB — EPSTEIN-BARR VIRUS VCA, IGM: EBV VCA IgM: <36 U/mL

## 2024-05-16 LAB — EPSTEIN-BARR VIRUS VCA, IGG: EBV VCA IgG: 23.3 U/mL — ABNORMAL HIGH

## 2024-05-28 ENCOUNTER — Other Ambulatory Visit (HOSPITAL_COMMUNITY): Payer: Self-pay

## 2024-05-28 ENCOUNTER — Ambulatory Visit: Attending: Nurse Practitioner | Admitting: Nurse Practitioner

## 2024-05-28 ENCOUNTER — Encounter: Payer: Self-pay | Admitting: Nurse Practitioner

## 2024-05-28 VITALS — BP 118/88 | HR 80 | Ht 68.5 in | Wt 172.6 lb

## 2024-05-28 DIAGNOSIS — E782 Mixed hyperlipidemia: Secondary | ICD-10-CM

## 2024-05-28 DIAGNOSIS — R072 Precordial pain: Secondary | ICD-10-CM | POA: Diagnosis not present

## 2024-05-28 DIAGNOSIS — Z8249 Family history of ischemic heart disease and other diseases of the circulatory system: Secondary | ICD-10-CM | POA: Diagnosis not present

## 2024-05-28 DIAGNOSIS — E039 Hypothyroidism, unspecified: Secondary | ICD-10-CM

## 2024-05-28 DIAGNOSIS — R5382 Chronic fatigue, unspecified: Secondary | ICD-10-CM

## 2024-05-28 DIAGNOSIS — K219 Gastro-esophageal reflux disease without esophagitis: Secondary | ICD-10-CM

## 2024-05-28 DIAGNOSIS — N179 Acute kidney failure, unspecified: Secondary | ICD-10-CM

## 2024-05-28 MED ORDER — METOPROLOL TARTRATE 100 MG PO TABS
100.0000 mg | ORAL_TABLET | Freq: Once | ORAL | 0 refills | Status: DC
  Filled 2024-05-28: qty 1, 1d supply, fill #0

## 2024-05-28 NOTE — Progress Notes (Signed)
 Office Visit    Patient Name: Benjamin Owen Date of Encounter: 05/28/2024  Primary Care Provider:  Johnny Garnette DELENA, MD Primary Cardiologist:  None  Chief Complaint    48 year old male with a history of chest pain, hyperlipidemia, family history of premature CAD, GERD with suspected esophageal spasm, migraines, hypothyroidism, and anxiety who presents for [facility] new patient evaluation.  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 Jakie, GERD only    LASIK     TONSILLECTOMY      Allergies  Allergies  Allergen Reactions   Ultram  [Tramadol  Hcl] Nausea Only     Labs/Other Studies Reviewed    The following studies were reviewed today:     Recent Labs: 06/26/2023: TSH 2.39 05/06/2024: ALT 28; BUN 24; Creatinine, Ser 1.89; Hemoglobin 14.8; Platelets 217.0; Potassium 4.2; Sodium 140  Recent Lipid Panel    Component Value Date/Time   CHOL 228 (H) 06/26/2023 0836   TRIG 274.0 (H) 06/26/2023 0836   HDL 42.30 06/26/2023 0836   CHOLHDL 5 06/26/2023 0836   VLDL 54.8 (H) 06/26/2023 0836   LDLCALC 131 (H) 06/26/2023 0836    History of Present Illness    48 year old male with a history of chest pain, hyperlipidemia, family history of premature CAD, GERD with suspected esophageal spasm, migraines, hypothyroidism, and anxiety.  He saw his PCP on 05/06/2024 and reported intermittent chest pain, nausea, and vomiting that began after taking Cipro  for suspected prostatitis.  His symptoms were felt to be consistent with esophageal spasms. EKG showed NSR, 77 bpm, artifact.   He was also he was advised to follow-up with his GI doctor and was referred to cardiology for further evaluation.  He presents today for heart first clinic new patient evaluation.  He has a longstanding history of GERD with concern for esophageal spasms, followed by GI. He is pending EGD/colonoscopy in 06/2024.  He has a strong family history of CAD.  His father had bypass surgery at the age of 39, he also underwent valve replacement at the time (specific valve unknown).  He died at the age of 28.  History of hypertension, hyperlipidemia, and A-fib in his mother.   He reports a longstanding history of intermittent chest discomfort which he describes as a burning sensation, located under his left breast, occasionally with exertion, but can also occur at rest, lasts for minutes and resolves spontaneously.  He denies any associated symptoms, though he does report generalized fatigue.  He drinks 2 to 3 cups of coffee a day, he reports mild EtOH use, 1-2 drinks per month.  Denies any drug use, tobacco use.  He walks occasionally for exercise.  He works as an doctor, hospital.  He is married, he has 2 children ages 33 and 2.  Home Medications    Current Outpatient Medications  Medication Sig Dispense Refill   atorvastatin  (LIPITOR) 10 MG tablet Take 1 tablet (10 mg total) by mouth daily. 90 tablet 3   [employer] ALLERGY RELIEF 180 MG tablet TAKE 1 TABLET  EVERY DAY 30 tablet 6   escitalopram  (LEXAPRO ) 10 MG tablet Take 1 tablet by mouth every day. Patient needs office visit for continuation of refills 90 tablet 3   famotidine  (PEPCID ) 40 MG tablet Take 1 tablet (40 mg total) by mouth at bedtime.     levothyroxine  (SYNTHROID ) 100 MCG tablet Take 1 tablet (100 mcg total) by mouth daily before breakfast. 90 tablet 3   montelukast  (SINGULAIR ) 10 MG tablet TAKE 1 TABLET BY MOUTH EVERYDAY AT BEDTIME. Patient needs office visit for continuation of refills 90 tablet 3   Multiple Vitamin (MULTIVITAMIN)  capsule Take 1 capsule by mouth daily.       pantoprazole  (PROTONIX ) 40 MG tablet Take 1 tablet (40 mg total) by mouth 2 (two) times daily before a meal. 180 tablet 3   No current facility-administered medications for this visit.     Review of Systems    He denies palpitations, dyspnea, pnd, orthopnea, n, v, dizziness, syncope, edema, weight gain, or early satiety. All other systems reviewed and are otherwise negative except as noted above.   Physical Exam    VS:  BP 118/88   Pulse 80   Ht 5' 8.5 (1.74 m)   Wt 172 lb 9.6 oz (78.3 kg)   SpO2 97%   BMI 25.86 kg/m   GEN: Well nourished, well developed, in no acute distress. HEENT: normal. Neck: Supple, no JVD, carotid bruits, or masses. Cardiac: RRR, no murmurs, rubs, or gallops. No clubbing, cyanosis, edema.  Radials/DP/PT 2+ and equal bilaterally.  Respiratory:  Respirations regular and unlabored, clear to auscultation bilaterally. GI: Soft, nontender, nondistended, BS + x 4. MS: no deformity or atrophy. Skin: warm and dry, no rash. Neuro:  Strength and sensation are intact. Psych: Normal affect.  Accessory Clinical Findings    ECG personally reviewed by me today -    - no EKG in office today. EKG from 05/06/2024 showed NSR, 77 bpm, artifact, no acute ST/T wave changes.    Lab Results  Component Value Date   WBC 11.1 (H) 05/06/2024   HGB 14.8 05/06/2024   HCT 44.8 05/06/2024   MCV 89.2 05/06/2024   PLT 217.0 05/06/2024   Lab Results  Component Value Date   CREATININE 1.89 (H) 05/06/2024   BUN 24 (H) 05/06/2024   NA 140 05/06/2024   K 4.2 05/06/2024   CL 102 05/06/2024   CO2 30 05/06/2024   Lab Results  Component Value Date   ALT 28 05/06/2024   AST 30 05/06/2024   ALKPHOS 55 05/06/2024   BILITOT 0.8 05/06/2024   Lab Results  Component Value Date   CHOL 228 (H) 06/26/2023   HDL 42.30 06/26/2023   LDLCALC 131 (H) 06/26/2023   TRIG 274.0 (H) 06/26/2023   CHOLHDL 5 06/26/2023    Lab Results  Component  Value Date   HGBA1C 5.8 06/26/2023    Assessment & Plan    1. Chest pain/family history of premature CAD: His father had CABG x 4 with valve replacement at the age of 51, died at the age of 76.  He reports a longstanding history of intermittent chest discomfort which he describes as a burning sensation, located under his left breast, occasionally with exertion, but can also occur at rest, lasts for minutes and resolves spontaneously.  Denies any associated symptoms.  He also reports generalized fatigue.  Through shared decision making, and for further risk stratification, will pursue coronary CT angiogram.  Will update CMET prior to CT.  He will take metoprolol 100 mg prior to scan.  Will check echocardiogram.  Reviewed ED precautions.  2. Hyperlipidemia: LDL was 131 in 06/2023. Will update fasting lipids, LP(a), CMET.  He will likely require escalation of statin therapy.   3. GERD/esophageal spasm: Longstanding history of GERD, follows with GI.  Recent concern for esophageal spasm, possibly contributing to symptoms of chest discomfort.  He is pending EGD/colonoscopy per GI in 06/2024.  4. Hypothyroidism/generalized fatigue: He notes generalized fatigue.  Denies any history of snoring.  TSH was 2.390 in 06/2023.  Will update TSH, will check CBC.  On levothyroxine .  Managed per PCP.  5. AKI: Creatinine was 1.89 in 04/2024.  No history of CKD.  He was taking Cipro  at the time for treatment of prostatitis.  AKI was noted to likely be in the setting of dehydration. Repeat CMET pending.  Encouraged adequate fluid intake.   6. Disposition: Follow-up in 6 to 8 weeks with Dr. Barbaraann or APP.       Damien JAYSON Braver, NP 05/28/2024, 9:48 AM

## 2024-05-28 NOTE — Patient Instructions (Signed)
 Medication Instructions:  Your physician recommends that you continue on your current medications as directed. Please refer to the Current Medication list given to you today.  *If you need a refill on your cardiac medications before your next appointment, please call your pharmacy*  Lab Work: COME BACK TO THE LAB IN THE NEXT COUPLE DAYS, FASTING, FOR:  LIPID, LPa, CBC, CMET, & TSH  If you have labs (blood work) drawn today and your tests are completely normal, you will receive your results only by: MyChart Message (if you have MyChart) OR A paper copy in the mail If you have any lab test that is abnormal or we need to change your treatment, we will call you to review the results.  Testing/Procedures: Your physician has requested that you have an echocardiogram. Echocardiography is a painless test that uses sound waves to create images of your heart. It provides your doctor with information about the size and shape of your heart and how well your heart's chambers and valves are working. This procedure takes approximately one hour. There are no restrictions for this procedure. Please do NOT wear cologne, perfume, aftershave, or lotions (deodorant is allowed). Please arrive 15 minutes prior to your appointment time.  Please note: We ask at that you not bring children with you during ultrasound (echo/ vascular) testing. Due to room size and safety concerns, children are not allowed in the ultrasound rooms during exams. Our front office staff cannot provide observation of children in our lobby area while testing is being conducted. An adult accompanying a patient to their appointment will only be allowed in the ultrasound room at the discretion of the ultrasound technician under special circumstances. We apologize for any inconvenience.    Your physician has requested that you have cardiac CT. Cardiac computed tomography (CT) is a painless test that uses an x-ray machine to take clear, detailed  pictures of your heart. For further information please visit [email or web address] Please follow instruction sheet BELOW:    Your cardiac CT will be scheduled at one of the below locations:   [facility] [address] [place], [place] [address] [phone] (Severe contrast allergies only)  OR   [facility] [address] [place], [place] [address] [phone]  OR   [facility] [address] [place], [place] [address] [phone]  OR   [facility] and V[facility] Tower [address]  [place], [place] [address]  OR   [facility] [address] [place], [place] [phone]  If scheduled at [facility], please arrive at the [facility] and Children's Entrance (Entrance C2) of [facility] 30 minutes prior to test start time. You can use the FREE valet parking offered at entrance C (encouraged to control the heart rate for the test)  Proceed to the [facility] Radiology Department (first floor) to check-in and test prep.  All radiology patients and guests should use entrance C2 at [facility], accessed from [facility], even though the hospital's physical address listed is [address].  If scheduled at the Heart and Vascular Tower at [address], please enter the parking lot using the [address] entrance and use the FREE valet service at the patient drop-off area. Enter the building and check-in with registration on the main floor.  If scheduled at [facility], please arrive to the Heart and Vascular Center 15 mins early for check-in and test prep.  There  is spacious parking and easy access to the radiology department from the [facility] Heart and Vascular entrance. Please enter here and check-in with the desk attendant.   If scheduled at [facility], please arrive 30 minutes early for check-in and test prep.  Please follow  these instructions carefully (unless otherwise directed):  An IV will be required for this test and Nitroglycerin will be given.  Hold all erectile dysfunction medications at least 3 days (72 hrs) prior to test. (Ie viagra, cialis, sildenafil, tadalafil, etc)   On the Night Before the Test: Be sure to Drink plenty of water. Do not consume any caffeinated/decaffeinated beverages or chocolate 12 hours prior to your test. Do not take any antihistamines 12 hours prior to your test.   On the Day of the Test: Drink plenty of water until 1 hour prior to the test. Do not eat any food 1 hour prior to test. You may take your regular medications prior to the test.  Take metoprolol  (Lopressor ) 100 MG two hours prior to test.  THIS HAS BEEN SENT TO OUR PHARMACY DOWNSTAIRS       After the Test: Drink plenty of water. After receiving IV contrast, you may experience a mild flushed feeling. This is normal. On occasion, you may experience a mild rash up to 24 hours after the test. This is not dangerous. If this occurs, you can take Benadryl 25 mg, Zyrtec, Claritin , or Allegra  and increase your fluid intake. (Patients taking Tikosyn should avoid Benadryl, and may take Zyrtec, Claritin , or Allegra ) If you experience trouble breathing, this can be serious. If it is severe call 911 IMMEDIATELY. If it is mild, please call our office.  We will call to schedule your test 2-4 weeks out understanding that some insurance companies will need an authorization prior to the service being performed.   For more information and frequently asked questions, please visit our website : [email or web address]  For non-scheduling related questions, please contact the cardiac imaging nurse navigator should you have any questions/concerns: Cardiac Imaging Nurse Navigators Direct Office Dial: [phone]   For scheduling needs, including cancellations and rescheduling, please call Brittany,  [phone].   Follow-Up: At [facility], you and your health needs are our priority.  As part of our continuing mission to provide you with exceptional heart care, our providers are all part of one team.  This team includes your primary Cardiologist (physician) and Advanced Practice Providers or APPs (Physician Assistants and Nurse Practitioners) who all work together to provide you with the care you need, when you need it.  Your next appointment:   6-8  week(s)  Provider:   Darryle ONEIDA Decent, MD or Damien Braver, NP          We recommend signing up for the patient portal called MyChart.  Sign up information is provided on this After Visit Summary.  MyChart is used to connect with patients for Virtual Visits (Telemedicine).  Patients are able to view lab/test results, encounter notes, upcoming appointments, etc.  Non-urgent messages can be sent to your provider as well.   To learn more about what you can do with MyChart, go to [email or web address].   Other Instructions

## 2024-06-26 ENCOUNTER — Other Ambulatory Visit: Payer: Self-pay | Admitting: Family Medicine

## 2024-06-26 DIAGNOSIS — E039 Hypothyroidism, unspecified: Secondary | ICD-10-CM

## 2024-06-27 DIAGNOSIS — K449 Diaphragmatic hernia without obstruction or gangrene: Secondary | ICD-10-CM | POA: Diagnosis not present

## 2024-06-27 DIAGNOSIS — K635 Polyp of colon: Secondary | ICD-10-CM | POA: Diagnosis not present

## 2024-06-27 DIAGNOSIS — Z1211 Encounter for screening for malignant neoplasm of colon: Secondary | ICD-10-CM | POA: Diagnosis not present

## 2024-07-07 ENCOUNTER — Ambulatory Visit (HOSPITAL_COMMUNITY)
Admission: RE | Admit: 2024-07-07 | Discharge: 2024-07-07 | Disposition: A | Discharge status: Home / Self Care | Source: Ambulatory Visit | Attending: Nurse Practitioner | Admitting: Nurse Practitioner

## 2024-07-07 ENCOUNTER — Ambulatory Visit: Payer: Self-pay | Admitting: Nurse Practitioner

## 2024-07-07 DIAGNOSIS — E782 Mixed hyperlipidemia: Secondary | ICD-10-CM | POA: Insufficient documentation

## 2024-07-07 DIAGNOSIS — R079 Chest pain, unspecified: Secondary | ICD-10-CM | POA: Diagnosis not present

## 2024-07-07 DIAGNOSIS — E039 Hypothyroidism, unspecified: Secondary | ICD-10-CM | POA: Insufficient documentation

## 2024-07-07 DIAGNOSIS — Z8249 Family history of ischemic heart disease and other diseases of the circulatory system: Secondary | ICD-10-CM | POA: Diagnosis not present

## 2024-07-07 DIAGNOSIS — R072 Precordial pain: Secondary | ICD-10-CM | POA: Insufficient documentation

## 2024-07-07 LAB — ECHOCARDIOGRAM COMPLETE
Area-P 1/2: 4.57 cm2
MV M vel: 5.12 m/s
MV Peak grad: 104.9 mmHg
S' Lateral: 3 cm

## 2024-07-13 NOTE — Progress Notes (Signed)
 " Cardiology Office Note:  .   Date:  07/17/2024  ID:  Benjamin Owen, DOB 1975/09/04, MRN [id number] PCP: Johnny Garnette DELENA, MD  [facility] Providers Cardiologist:  Darryle ONEIDA Decent, MD   History of Present Illness: .    Chief Complaint  Patient presents with   Follow-up    Benjamin Owen is a 49 y.o. male with below history who presents for follow-up. Seen 11/19 for CP. CCTA not done. Echo normal.   History of Present Illness   Benjamin Owen is a 49 year old male with hyperlipidemia who presents for follow-up of chest pain.  He experiences sharp chest pain located in the chest area, which is consistent and occasionally persists despite treatment. He was previously seen in November for this issue, and an echocardiogram was performed, which was normal. A coronary CTA has not yet been performed. He has a history of gastroesophageal reflux disease (GERD) and experiences frequent heartburn. Treatment for esophageal spasms seemed to help alleviate the symptoms.  His family history is significant for heart disease; his father passed away from heart disease at the age of 78 and had undergone quadruple bypass surgery and valve replacement at 64 or 61. His mother has a history of high cholesterol and blood pressure problems.  He is currently taking atorvastatin  for hyperlipidemia and medication for an underactive thyroid . He does not smoke and consumes alcohol occasionally, with a maximum of one to two beers per week. He works as an museum/gallery curator, is married, and has two teenage children.  He underwent recent endoscopy and colonoscopy procedures. He also mentions a previous episode of acute kidney injury (AKI) noted in October.           Problem List HLD -T chol 228, HDL 42, LDL 131, TG 274 2. GERD    ROS: All other ROS reviewed and negative. Pertinent positives noted in the HPI.     Studies Reviewed: SABRA       TTE 07/07/2024  1. Left  ventricular ejection fraction, by estimation, is 55 to 60%. Left  ventricular ejection fraction by 3D volume is 55 %. The left ventricle has  normal function. The left ventricle has no regional wall motion  abnormalities. Left ventricular diastolic   parameters were normal. The average left ventricular global longitudinal  strain is -19.8 %. The global longitudinal strain is normal.   2. Right ventricular systolic function is normal. The right ventricular  size is normal. The estimated right ventricular systolic pressure is 29.3  mmHg.   3. Left atrial size was mildly dilated.   4. The mitral valve is normal in structure. Mild to moderate mitral valve  regurgitation. No evidence of mitral stenosis.   5. Tricuspid valve regurgitation is mild to moderate.   6. The aortic valve is tricuspid. Aortic valve regurgitation is trivial.  No aortic stenosis is present.   7. The inferior vena cava is dilated in size with >50% respiratory  variability, suggesting right atrial pressure of 8 mmHg.   Physical Exam:   VS:  BP 110/68   Pulse 74   Ht 5' 8 (1.727 m)   Wt 173 lb 9.6 oz (78.7 kg)   SpO2 99%   BMI 26.40 kg/m    Wt Readings from Last 3 Encounters:  07/17/24 173 lb 9.6 oz (78.7 kg)  05/28/24 172 lb 9.6 oz (78.3 kg)  05/06/24 169 lb 12.8 oz (77 kg)    GEN: Well nourished,  well developed in no acute distress NECK: No JVD; No carotid bruits CARDIAC: RRR, no murmurs, rubs, gallops RESPIRATORY:  Clear to auscultation without rales, wheezing or rhonchi  ABDOMEN: Soft, non-tender, non-distended EXTREMITIES:  No edema; No deformity  ASSESSMENT AND PLAN: .   Assessment and Plan    Precordial pain Intermittent sharp chest pain, likely non-cardiac, possibly esophageal spasms or GERD. Strong family history of heart disease. Coronary CTA considered for baseline coronary assessment. Discussed benefits of contrasted CT over calcium  score test. - Ordered coronary CTA with contrast, contingent on  normal kidney function. - Administer 100 mg metoprolol  before the scan.  Mixed hyperlipidemia Elevated LDL despite atorvastatin . Family history of hyperlipidemia and heart disease. Plan to recheck fasting lipids and perform LPA genetic marker test. - Will recheck fasting lipids. - Will perform LPA genetic marker test.  Acute kidney injury Previous labs showed elevated creatinine indicating acute kidney injury. Need to recheck kidney function before coronary CTA. - Will recheck creatinine levels.                Follow-up: Return if symptoms worsen or fail to improve.  Signed, Darryle DASEN. Barbaraann, MD, [facility]  [facility]  [address] [place], [place] [address] [phone]  8:49 AM   "

## 2024-07-17 ENCOUNTER — Other Ambulatory Visit (HOSPITAL_COMMUNITY): Payer: Self-pay

## 2024-07-17 ENCOUNTER — Encounter: Payer: Self-pay | Admitting: Cardiovascular Disease

## 2024-07-17 ENCOUNTER — Ambulatory Visit: Attending: Cardiovascular Disease | Admitting: Cardiovascular Disease

## 2024-07-17 VITALS — BP 110/68 | HR 74 | Ht 68.0 in | Wt 173.6 lb

## 2024-07-17 DIAGNOSIS — E782 Mixed hyperlipidemia: Secondary | ICD-10-CM | POA: Diagnosis not present

## 2024-07-17 DIAGNOSIS — R072 Precordial pain: Secondary | ICD-10-CM

## 2024-07-17 DIAGNOSIS — E039 Hypothyroidism, unspecified: Secondary | ICD-10-CM | POA: Diagnosis not present

## 2024-07-17 DIAGNOSIS — N179 Acute kidney failure, unspecified: Secondary | ICD-10-CM

## 2024-07-17 DIAGNOSIS — Z8249 Family history of ischemic heart disease and other diseases of the circulatory system: Secondary | ICD-10-CM

## 2024-07-17 MED ORDER — METOPROLOL TARTRATE 100 MG PO TABS
100.0000 mg | ORAL_TABLET | Freq: Once | ORAL | 0 refills | Status: AC
  Filled 2024-07-17: qty 1, 1d supply, fill #0

## 2024-07-17 NOTE — Patient Instructions (Addendum)
 Medication Instructions:  Your physician recommends that you continue on your current medications as directed. Please refer to the Current Medication list given to you today.  *If you need a refill on your cardiac medications before your next appointment, please call your pharmacy*  Lab Work: Your physician recommends that you return for lab work in: The next week or so for FASTING Lipids, LP(a) & BMET  If you have labs (blood work) drawn today and your tests are completely normal, you will receive your results only by: MyChart Message (if you have MyChart) OR A paper copy in the mail If you have any lab test that is abnormal or we need to change your treatment, we will call you to review the results.  Testing/Procedures: See below  Follow-Up: At [facility], you and your health needs are our priority.  As part of our continuing mission to provide you with exceptional heart care, our providers are all part of one team.  This team includes your primary Cardiologist (physician) and Advanced Practice Providers or APPs (Physician Assistants and Nurse Practitioners) who all work together to provide you with the care you need, when you need it.  Your next appointment:   2 year(s)  Provider:   Darryle ONEIDA Decent, MD    We recommend signing up for the patient portal called MyChart.  Sign up information is provided on this After Visit Summary.  MyChart is used to connect with patients for Virtual Visits (Telemedicine).  Patients are able to view lab/test results, encounter notes, upcoming appointments, etc.  Non-urgent messages can be sent to your provider as well.   To learn more about what you can do with MyChart, go to [email or web address].   Other Instructions   Your cardiac CT will be scheduled at the below location:    [facility] [address]  [place], [place] [address]   If scheduled at the Heart and Vascular Tower at [address],  please enter the parking lot using the [address] entrance and use the FREE valet service at the patient drop-off area. Enter the building and check-in with registration on the main floor.   Please follow these instructions carefully (unless otherwise directed):  An IV will be required for this test and Nitroglycerin will be given.  Hold all erectile dysfunction medications at least 3 days (72 hrs) prior to test. (Ie viagra, cialis, sildenafil, tadalafil, etc)   On the Night Before the Test: Be sure to Drink plenty of water. Do not consume any caffeinated/decaffeinated beverages or chocolate 12 hours prior to your test. Do not take any antihistamines 12 hours prior to your test.  On the Day of the Test: Drink plenty of water until 1 hour prior to the test. Do not eat any food 1 hour prior to test. You may take your regular medications prior to the test.  Take metoprolol  (Lopressor ) 100mg  two hours prior to test. Patients who wear a continuous glucose monitor MUST remove the device prior to scanning.       After the Test: Drink plenty of water. After receiving IV contrast, you may experience a mild flushed feeling. This is normal. On occasion, you may experience a mild rash up to 24 hours after the test. This is not dangerous. If this occurs, you can take Benadryl 25 mg, Zyrtec, Claritin , or Allegra  and increase your fluid intake. (Patients taking Tikosyn should avoid Benadryl, and may take Zyrtec, Claritin , or Allegra ) If you experience trouble  breathing, this can be serious. If it is severe call 911 IMMEDIATELY. If it is mild, please call our office.  We will call to schedule your test 2-4 weeks out understanding that some insurance companies will need an authorization prior to the service being performed.   For more information and frequently asked questions, please visit our website : [email or web address]  For non-scheduling related questions, please contact the  cardiac imaging nurse navigator should you have any questions/concerns: Cardiac Imaging Nurse Navigators Direct Office Dial: [phone]   For scheduling needs, including cancellations and rescheduling, please call Brittany, [phone].

## 2024-07-30 ENCOUNTER — Encounter (HOSPITAL_COMMUNITY): Payer: Self-pay

## 2024-08-01 ENCOUNTER — Other Ambulatory Visit (HOSPITAL_BASED_OUTPATIENT_CLINIC_OR_DEPARTMENT_OTHER): Payer: Self-pay | Admitting: Cardiology

## 2024-08-01 ENCOUNTER — Ambulatory Visit (HOSPITAL_COMMUNITY)
Admission: RE | Admit: 2024-08-01 | Discharge: 2024-08-01 | Disposition: A | Source: Ambulatory Visit | Attending: Cardiology

## 2024-08-01 ENCOUNTER — Ambulatory Visit (HOSPITAL_COMMUNITY)
Admission: RE | Admit: 2024-08-01 | Discharge: 2024-08-01 | Disposition: A | Source: Ambulatory Visit | Attending: Cardiovascular Disease

## 2024-08-01 DIAGNOSIS — R931 Abnormal findings on diagnostic imaging of heart and coronary circulation: Secondary | ICD-10-CM

## 2024-08-01 DIAGNOSIS — I2510 Atherosclerotic heart disease of native coronary artery without angina pectoris: Secondary | ICD-10-CM | POA: Diagnosis not present

## 2024-08-01 DIAGNOSIS — R072 Precordial pain: Secondary | ICD-10-CM | POA: Diagnosis present

## 2024-08-01 MED ORDER — IOHEXOL 350 MG/ML SOLN
100.0000 mL | Freq: Once | INTRAVENOUS | Status: AC | PRN
  Administered 2024-08-01: 100 mL via INTRAVENOUS

## 2024-08-01 MED ORDER — NITROGLYCERIN 0.4 MG SL SUBL
0.8000 mg | SUBLINGUAL_TABLET | Freq: Once | SUBLINGUAL | Status: AC
  Administered 2024-08-01: 0.8 mg via SUBLINGUAL

## 2024-08-03 ENCOUNTER — Ambulatory Visit: Payer: Self-pay | Admitting: Cardiovascular Disease

## 2024-08-29 ENCOUNTER — Ambulatory Visit: Admitting: Cardiology
